# Patient Record
Sex: Male | Born: 1970 | Race: Black or African American | Hispanic: No | Marital: Married | Smoking: Former smoker
Health system: Southern US, Community
[De-identification: ages and names within clinical notes are randomized; demographics above are authoritative.]

## PROBLEM LIST (undated history)

## (undated) DIAGNOSIS — E079 Disorder of thyroid, unspecified: Secondary | ICD-10-CM

## (undated) DIAGNOSIS — I10 Essential (primary) hypertension: Secondary | ICD-10-CM

## (undated) HISTORY — DX: Disorder of thyroid, unspecified: E07.9

## (undated) HISTORY — DX: Essential (primary) hypertension: I10

---

## 2015-01-27 LAB — TSH: TSH: 0.01 u[IU]/mL — AB (ref <=5.90)

## 2015-02-13 ENCOUNTER — Other Ambulatory Visit: Payer: Self-pay | Admitting: "Endocrinology

## 2015-02-13 ENCOUNTER — Ambulatory Visit (INDEPENDENT_AMBULATORY_CARE_PROVIDER_SITE_OTHER): Payer: BLUE CROSS/BLUE SHIELD | Admitting: "Endocrinology

## 2015-02-13 ENCOUNTER — Encounter: Payer: Self-pay | Admitting: "Endocrinology

## 2015-02-13 VITALS — BP 117/78 | HR 95 | Ht 69.0 in | Wt 195.0 lb

## 2015-02-13 DIAGNOSIS — E0590 Thyrotoxicosis, unspecified without thyrotoxic crisis or storm: Secondary | ICD-10-CM

## 2015-02-13 LAB — TSH

## 2015-02-13 LAB — T4, FREE: Free T4: 4.31 ng/dL — ABNORMAL HIGH (ref 0.80–1.80)

## 2015-02-13 MED ORDER — METOPROLOL SUCCINATE ER 50 MG PO TB24: 50.0000 mg | ORAL_TABLET | Freq: Every day | ORAL | Status: DC

## 2015-02-13 NOTE — Progress Notes (Signed)
Subjective:    Patient ID: Darrell Patrick, male    DOB: [birth year],    Past Medical History  Diagnosis Date  . Thyroid disease   . Hypertension    History reviewed. No pertinent past surgical history. Social History   Social History  . Marital Status: Married    Spouse Name: N/A  . Number of Children: N/A  . Years of Education: N/A   Social History Main Topics  . Smoking status: Former Games developer  . Smokeless tobacco: None  . Alcohol Use: 0.0 oz/week    0 Standard drinks or equivalent per week     Comment: Social  . Drug Use: No  . Sexual Activity: Not Asked   Other Topics Concern  . None   Social History Narrative  . None   Outpatient Encounter Prescriptions as of 02/13/2015  Medication Sig  . amLODipine (NORVASC) 10 MG tablet Take 10 mg by mouth daily.  . hydrochlorothiazide (HYDRODIURIL) 50 MG tablet Take 50 mg by mouth daily.  Marland Kitchen LORazepam (ATIVAN) 1 MG tablet Take 1 mg by mouth every 6 (six) hours as needed for anxiety.  . metoprolol succinate (TOPROL-XL) 50 MG 24 hr tablet Take 1 tablet (50 mg total) by mouth daily. Take with or immediately following a meal.  . potassium chloride (K-DUR) 10 MEQ tablet Take 10 mEq by mouth daily.  . [DISCONTINUED] metoprolol succinate (TOPROL-XL) 50 MG 24 hr tablet Take 50 mg by mouth daily. Take with or immediately following a meal.    No facility-administered encounter medications on file as of 02/13/2015.   ALLERGIES: No Known Allergies VACCINATION STATUS:  There is no immunization history on file for this patient.  HPI  The patient presents today with a medical history as above, and is being seen in consultation for hyperthyroidism requested by Dr. Ardean Larsen.  The patient has been dealing with symptoms of palpitations, weight loss of 25 lbs since February 2016, heat intolerance, sweating, fatigue.  These symptoms are progressively worsening and troubling to patient. He visited the Hospital in [place] where he was told  to have hyperthyroidism , started on Metoprolol 25 mg po qday. He has suppressed TSH on 2 occasions, the last one from 01/27/2015 less than 0.006. Has no documented T4 or T3. He has had some relief from tremors and palpitations.  The patient denies family history of thyroid dysfunction  .  patient denies personal history of goiter. Patient is not on any anti-thyroid medications nor on any thyroid hormone supplements. Patient  is willing to proceed with appropriate work up and therapy for thyrotoxicosis.   Review of Systems  Constitutional: Positive for fatigue and unexpected weight change.  HENT: Negative for dental problem, mouth sores and trouble swallowing.   Eyes: Negative for visual disturbance.  Respiratory: Negative for cough, choking, chest tightness, shortness of breath and wheezing.   Cardiovascular: Positive for palpitations. Negative for chest pain and leg swelling.  Gastrointestinal: Negative for nausea, vomiting, abdominal pain, diarrhea, constipation and abdominal distention.  Endocrine: Positive for heat intolerance. Negative for polydipsia, polyphagia and polyuria.  Genitourinary: Negative for dysuria, urgency, hematuria and flank pain.  Musculoskeletal: Negative for myalgias, back pain, gait problem and neck pain.  Skin: Negative for pallor, rash and wound.  Neurological: Positive for tremors. Negative for seizures, syncope, weakness, numbness and headaches.  Psychiatric/Behavioral: Negative.  Negative for confusion and dysphoric mood.    Objective:    BP 117/78 mmHg  Pulse 95  Ht 5\' 9"  (1.753 m)  Wt 195 lb (88.451 kg)  BMI 28.78 kg/m2  SpO2 98%  Wt Readings from Last 3 Encounters:  02/13/15 195 lb (88.451 kg)    Physical Exam  Constitutional: He is oriented to person, place, and time. He appears well-developed and well-nourished. He is cooperative. No distress.  HENT:  Head: Normocephalic and atraumatic.  Eyes: EOM are normal.  Neck: Normal range of motion.  Neck supple. No tracheal deviation present. Thyromegaly present.  His thyroid is enlarged to 11/2 times normal and has bruits.  Cardiovascular: Normal rate, S1 normal, S2 normal and normal heart sounds.  Exam reveals no gallop.   No murmur heard. Pulses:      Dorsalis pedis pulses are 1+ on the right side, and 1+ on the left side.       Posterior tibial pulses are 1+ on the right side, and 1+ on the left side.  Pulmonary/Chest: Breath sounds normal. No respiratory distress. He has no wheezes.  Abdominal: Soft. Bowel sounds are normal. He exhibits no distension. There is no tenderness. There is no guarding and no CVA tenderness.  Musculoskeletal: He exhibits no edema.       Right shoulder: He exhibits no swelling and no deformity.  Neurological: He is alert and oriented to person, place, and time. He has normal strength and normal reflexes. No cranial nerve deficit or sensory deficit. Gait normal.  Skin: Skin is warm and dry. No rash noted. No cyanosis. Nails show no clubbing.  Psychiatric: He has a normal mood and affect. His speech is normal and behavior is normal. Judgment and thought content normal. Cognition and memory are normal.        Assessment & Plan:   1. Hyperthyroidism  Patient's history and most recent labs are reviewed. Findings are consistent with thyrotoxicosis likely from hyperthyroidism. The potential risks of untreated thyrotoxicosis and the need for definitive therapy have been discussed in detail with the patient, and the patient agrees to proceed with plan.   I like to repeat full profile thyroid function tests today and confirmatory thyroid uptake and scan will be scheduled to be done as soon as possible.  He will be sent to lab today to obtain: - TSH, free t4, and - Thyroid peroxidase antibody/ Thyroglobulin antibody  - NM THYROID SNG UPTAKE W/IMAGING  Therapy may involve RAI ablation of the thyroid, with subsequent need for lifelong thyroid hormone  replacement. Pt is made aware of this fact and willing to proceed. The patient will return in 10 days for treatment decision. I increase his metoprolol to 50 mg by mouth dailyl for symptomatic relief.  Follow up plan: Return in about 10 days (around 02/23/2015) for overactive thyroid.  Marquis Lunch, MD Phone: [phone]  Fax: [phone]   02/13/2015, 9:18 AM

## 2015-02-14 LAB — THYROGLOBULIN ANTIBODY: Thyroglobulin Ab: 1 IU/mL (ref <2)

## 2015-02-14 LAB — THYROID PEROXIDASE ANTIBODY: THYROID PEROXIDASE ANTIBODY: 807 [IU]/mL — AB (ref <9)

## 2015-02-18 ENCOUNTER — Telehealth: Payer: Self-pay

## 2015-02-18 ENCOUNTER — Telehealth: Payer: Self-pay | Admitting: "Endocrinology

## 2015-02-18 NOTE — Telephone Encounter (Signed)
Pts wife left message requesting a refill on Ativan. Dr Fransico Him does not prescribe Ativan. Called number provided. No answer. No voice.

## 2015-02-18 NOTE — Telephone Encounter (Signed)
wants ativan refilled: I let Mrs. Manner know that Dr. Fransico Him did not prescribe the ativan originally and he may not refill. Let her know she may have to go to her PCP to refill.

## 2015-02-19 ENCOUNTER — Encounter (HOSPITAL_COMMUNITY): Payer: BLUE CROSS/BLUE SHIELD

## 2015-02-19 NOTE — Telephone Encounter (Signed)
That is right. Patient has to contact who ever prescribed the ativan.

## 2015-02-20 ENCOUNTER — Other Ambulatory Visit (HOSPITAL_COMMUNITY): Payer: Self-pay

## 2015-02-23 ENCOUNTER — Encounter (HOSPITAL_COMMUNITY)
Admission: RE | Admit: 2015-02-23 | Discharge: 2015-02-23 | Disposition: A | Discharge status: Home / Self Care | Payer: BLUE CROSS/BLUE SHIELD | Source: Ambulatory Visit | Attending: "Endocrinology | Admitting: "Endocrinology

## 2015-02-23 ENCOUNTER — Encounter (HOSPITAL_COMMUNITY): Payer: Self-pay

## 2015-02-23 DIAGNOSIS — E0590 Thyrotoxicosis, unspecified without thyrotoxic crisis or storm: Secondary | ICD-10-CM

## 2015-02-23 DIAGNOSIS — I10 Essential (primary) hypertension: Secondary | ICD-10-CM | POA: Insufficient documentation

## 2015-02-23 DIAGNOSIS — R634 Abnormal weight loss: Secondary | ICD-10-CM | POA: Insufficient documentation

## 2015-02-23 DIAGNOSIS — Z87891 Personal history of nicotine dependence: Secondary | ICD-10-CM | POA: Insufficient documentation

## 2015-02-23 DIAGNOSIS — R002 Palpitations: Secondary | ICD-10-CM | POA: Insufficient documentation

## 2015-02-23 MED ORDER — SODIUM IODIDE I 131 CAPSULE
14.0000 | Freq: Once | INTRAVENOUS | Status: AC | PRN
  Administered 2015-02-23: 14 via ORAL

## 2015-02-24 ENCOUNTER — Encounter (HOSPITAL_COMMUNITY): Payer: Self-pay

## 2015-02-24 ENCOUNTER — Encounter (HOSPITAL_COMMUNITY)
Admission: RE | Admit: 2015-02-24 | Discharge: 2015-02-24 | Disposition: A | Discharge status: Home / Self Care | Payer: BLUE CROSS/BLUE SHIELD | Source: Ambulatory Visit | Attending: "Endocrinology | Admitting: "Endocrinology

## 2015-02-24 DIAGNOSIS — I10 Essential (primary) hypertension: Secondary | ICD-10-CM | POA: Diagnosis not present

## 2015-02-24 DIAGNOSIS — R002 Palpitations: Secondary | ICD-10-CM | POA: Diagnosis not present

## 2015-02-24 DIAGNOSIS — Z87891 Personal history of nicotine dependence: Secondary | ICD-10-CM | POA: Diagnosis not present

## 2015-02-24 DIAGNOSIS — R634 Abnormal weight loss: Secondary | ICD-10-CM | POA: Diagnosis not present

## 2015-02-24 DIAGNOSIS — E0590 Thyrotoxicosis, unspecified without thyrotoxic crisis or storm: Secondary | ICD-10-CM | POA: Diagnosis present

## 2015-02-24 MED ORDER — SODIUM PERTECHNETATE TC 99M INJECTION: 10.0000 | Freq: Once | INTRAVENOUS | Status: DC | PRN

## 2015-02-26 ENCOUNTER — Ambulatory Visit: Payer: BLUE CROSS/BLUE SHIELD | Admitting: "Endocrinology

## 2015-03-04 ENCOUNTER — Encounter: Payer: Self-pay | Admitting: "Endocrinology

## 2015-03-04 ENCOUNTER — Ambulatory Visit (INDEPENDENT_AMBULATORY_CARE_PROVIDER_SITE_OTHER): Payer: BLUE CROSS/BLUE SHIELD | Admitting: "Endocrinology

## 2015-03-04 VITALS — BP 120/82 | HR 88 | Ht 69.0 in | Wt 185.0 lb

## 2015-03-04 DIAGNOSIS — E0590 Thyrotoxicosis, unspecified without thyrotoxic crisis or storm: Secondary | ICD-10-CM | POA: Diagnosis not present

## 2015-03-04 DIAGNOSIS — E0500 Thyrotoxicosis with diffuse goiter without thyrotoxic crisis or storm: Secondary | ICD-10-CM | POA: Diagnosis not present

## 2015-03-04 MED ORDER — METOPROLOL SUCCINATE ER 50 MG PO TB24: 50.0000 mg | ORAL_TABLET | Freq: Every day | ORAL | Status: DC

## 2015-03-04 NOTE — Progress Notes (Signed)
Subjective:    Patient ID: Darrell Patrick, male    DOB: November 18, 1970,    Past Medical History  Diagnosis Date  . Thyroid disease   . Hypertension    No past surgical history on file. Social History   Social History  . Marital Status: Married    Spouse Name: N/A  . Number of Children: N/A  . Years of Education: N/A   Social History Main Topics  . Smoking status: Former Games developer  . Smokeless tobacco: None  . Alcohol Use: 0.0 oz/week    0 Standard drinks or equivalent per week     Comment: Social  . Drug Use: No  . Sexual Activity: Not Asked   Other Topics Concern  . None   Social History Narrative   Outpatient Encounter Prescriptions as of 03/04/2015  Medication Sig  . amLODipine (NORVASC) 10 MG tablet Take 10 mg by mouth daily.  . hydrochlorothiazide (HYDRODIURIL) 50 MG tablet Take 50 mg by mouth daily.  Marland Kitchen LORazepam (ATIVAN) 1 MG tablet Take 1 mg by mouth every 6 (six) hours as needed for anxiety.  . metoprolol succinate (TOPROL-XL) 50 MG 24 hr tablet Take 1 tablet (50 mg total) by mouth daily. Take with or immediately following a meal.  . potassium chloride (K-DUR) 10 MEQ tablet Take 10 mEq by mouth daily.  . [DISCONTINUED] metoprolol succinate (TOPROL-XL) 50 MG 24 hr tablet Take 1 tablet (50 mg total) by mouth daily. Take with or immediately following a meal.   No facility-administered encounter medications on file as of 03/04/2015.   ALLERGIES: No Known Allergies VACCINATION STATUS:  There is no immunization history on file for this patient.  HPI  The patient presents today with a medical history as above. He returns for follow-up with repeat thyroid function test and thyroid uptake and scan. These studies confirm hyperthyroidism from Graves' disease.  The patient has been dealing with symptoms of palpitations, weight loss of 35  lbs since February 2016, heat intolerance, sweating, fatigue.  These symptoms are progressively worsening and troubling to patient.   He has had some relief from tremors and palpitations on metoprolol 50 mg extended release by mouth daily.  The patient denies family history of thyroid dysfunction  .  patient denies personal history of goiter. Patient is not on any anti-thyroid medications nor on any thyroid hormone supplements. Patient  is willing to proceed with appropriate therapy for thyrotoxicosis.   Review of Systems  Constitutional: Positive for fatigue and unexpected weight change.  HENT: Negative for dental problem, mouth sores and trouble swallowing.   Eyes: Negative for visual disturbance.  Respiratory: Negative for cough, choking, chest tightness, shortness of breath and wheezing.   Cardiovascular: Positive for palpitations. Negative for chest pain and leg swelling.  Gastrointestinal: Negative for nausea, vomiting, abdominal pain, diarrhea, constipation and abdominal distention.  Endocrine: Positive for heat intolerance. Negative for polydipsia, polyphagia and polyuria.  Genitourinary: Negative for dysuria, urgency, hematuria and flank pain.  Musculoskeletal: Negative for myalgias, back pain, gait problem and neck pain.  Skin: Negative for pallor, rash and wound.  Neurological: Positive for tremors. Negative for seizures, syncope, weakness, numbness and headaches.  Psychiatric/Behavioral: Negative.  Negative for confusion and dysphoric mood.    Objective:    BP 120/82 mmHg  Pulse 88  Ht 5\' 9"  (1.753 m)  Wt 185 lb (83.915 kg)  BMI 27.31 kg/m2  SpO2 98%  Wt Readings from Last 3 Encounters:  03/04/15 185 lb (83.915 kg)  02/13/15  195 lb (88.451 kg)    Physical Exam  Constitutional: He is oriented to person, place, and time. He appears well-developed and well-nourished. He is cooperative. No distress.  HENT:  Head: Normocephalic and atraumatic.  Eyes: EOM are normal.  Neck: Normal range of motion. Neck supple. No tracheal deviation present. Thyromegaly present.  His thyroid is enlarged to 11/2 times  normal and has bruits.  Cardiovascular: Normal rate, S1 normal, S2 normal and normal heart sounds.  Exam reveals no gallop.   No murmur heard. Pulses:      Dorsalis pedis pulses are 1+ on the right side, and 1+ on the left side.       Posterior tibial pulses are 1+ on the right side, and 1+ on the left side.  Pulmonary/Chest: Breath sounds normal. No respiratory distress. He has no wheezes.  Abdominal: Soft. Bowel sounds are normal. He exhibits no distension. There is no tenderness. There is no guarding and no CVA tenderness.  Musculoskeletal: He exhibits no edema.       Right shoulder: He exhibits no swelling and no deformity.  Neurological: He is alert and oriented to person, place, and time. He has normal strength and normal reflexes. No cranial nerve deficit or sensory deficit. Gait normal.  Skin: Skin is warm and dry. No rash noted. No cyanosis. Nails show no clubbing.  Psychiatric: He has a normal mood and affect. His speech is normal and behavior is normal. Judgment and thought content normal. Cognition and memory are normal.   Recent Results (from the past 2160 hour(s))  TSH     Status: Abnormal   Collection Time: 01/27/15 12:00 AM  Result Value Ref Range   TSH 0.01 (A) .41 - 5.90 uIU/mL  TSH     Status: Abnormal   Collection Time: 02/13/15  9:21 AM  Result Value Ref Range   TSH <0.008 (L) 0.350 - 4.500 uIU/mL  T4, free     Status: Abnormal   Collection Time: 02/13/15  9:21 AM  Result Value Ref Range   Free T4 4.31 (H) 0.80 - 1.80 ng/dL  Thyroid peroxidase antibody     Status: Abnormal   Collection Time: 02/13/15  9:21 AM  Result Value Ref Range   Thyroperoxidase Ab SerPl-aCnc 807 (H) <9 IU/mL  Thyroglobulin antibody     Status: None   Collection Time: 02/13/15  9:21 AM  Result Value Ref Range   Thyroglobulin Ab 1 <2 IU/mL   Nuclear medicine thyroid uptake and scan FINDINGS: 02/24/2015 Markedly elevated 24 hour radio iodine uptake of 70.3%. Homogeneous increased tracer  distribution throughout both thyroid lobes. Findings consistent with Graves disease.    Assessment & Plan:   1. Hyperthyroidism  Patient's history and most recent labs are reviewed. Findings are consistent with thyrotoxicosis likely from hyperthyroidism/Graves Disease. The potential risks of untreated thyrotoxicosis and the need for definitive therapy have been discussed in detail with the patient, and the patient agrees to proceed with plan.  -Options of therapy discussed with him. The best option for him will be RAI ablation of the thyroid, with subsequent need for lifelong thyroid hormone replacement. Pt is made aware of this fact and willing to proceed. I will arrange for this to be done as soon as possible. The patient will return in 6 weeks with repeat thyroid function test. I  advised him to continue metoprolol 50 mg by mouth daily for symptomatic relief.  Follow up plan: Return in about 6 weeks (around 04/15/2015) for overactive thyroid.  Marquis Lunch, MD Phone: [phone]  Fax: [phone]   03/04/2015, 9:21 AM

## 2015-03-06 ENCOUNTER — Encounter (HOSPITAL_COMMUNITY): Payer: BLUE CROSS/BLUE SHIELD

## 2015-03-09 ENCOUNTER — Encounter (HOSPITAL_COMMUNITY): Payer: Self-pay

## 2015-03-09 ENCOUNTER — Encounter (HOSPITAL_COMMUNITY)
Admission: RE | Admit: 2015-03-09 | Discharge: 2015-03-09 | Disposition: A | Discharge status: Home / Self Care | Payer: BLUE CROSS/BLUE SHIELD | Source: Ambulatory Visit | Attending: "Endocrinology | Admitting: "Endocrinology

## 2015-03-09 DIAGNOSIS — E0500 Thyrotoxicosis with diffuse goiter without thyrotoxic crisis or storm: Secondary | ICD-10-CM | POA: Diagnosis present

## 2015-03-09 MED ORDER — SODIUM IODIDE I 131 CAPSULE
15.0000 | Freq: Once | INTRAVENOUS | Status: AC | PRN
Start: 2015-03-09 — End: 2015-03-09
  Administered 2015-03-09: 14.7 via ORAL

## 2015-03-11 ENCOUNTER — Ambulatory Visit: Payer: Self-pay | Admitting: "Endocrinology

## 2015-04-15 ENCOUNTER — Ambulatory Visit: Payer: BLUE CROSS/BLUE SHIELD | Admitting: "Endocrinology

## 2015-04-30 ENCOUNTER — Other Ambulatory Visit: Payer: Self-pay | Admitting: "Endocrinology

## 2015-05-01 ENCOUNTER — Ambulatory Visit: Payer: BLUE CROSS/BLUE SHIELD | Admitting: "Endocrinology

## 2015-05-01 LAB — TSH: TSH: 0.596 u[IU]/mL (ref 0.450–4.500)

## 2015-05-01 LAB — T4, FREE: FREE T4: 0.3 ng/dL — AB (ref 0.82–1.77)

## 2015-05-08 ENCOUNTER — Ambulatory Visit (INDEPENDENT_AMBULATORY_CARE_PROVIDER_SITE_OTHER): Payer: BLUE CROSS/BLUE SHIELD | Admitting: "Endocrinology

## 2015-05-08 ENCOUNTER — Encounter: Payer: Self-pay | Admitting: "Endocrinology

## 2015-05-08 VITALS — BP 130/86 | HR 75 | Ht 69.0 in | Wt 206.0 lb

## 2015-05-08 DIAGNOSIS — I10 Essential (primary) hypertension: Secondary | ICD-10-CM | POA: Diagnosis not present

## 2015-05-08 DIAGNOSIS — E890 Postprocedural hypothyroidism: Secondary | ICD-10-CM | POA: Insufficient documentation

## 2015-05-08 DIAGNOSIS — E032 Hypothyroidism due to medicaments and other exogenous substances: Secondary | ICD-10-CM

## 2015-05-08 MED ORDER — LEVOTHYROXINE SODIUM 112 MCG PO TABS: 112.0000 ug | ORAL_TABLET | Freq: Every day | ORAL | Status: DC

## 2015-05-08 NOTE — Progress Notes (Signed)
Subjective:    Patient ID: Darrell Patrick, male    DOB: [birth year],    Past Medical History  Diagnosis Date  . Thyroid disease   . Hypertension    No past surgical history on file. Social History   Social History  . Marital Status: Married    Spouse Name: N/A  . Number of Children: N/A  . Years of Education: N/A   Social History Main Topics  . Smoking status: Former Games developer  . Smokeless tobacco: None  . Alcohol Use: 0.0 oz/week    0 Standard drinks or equivalent per week     Comment: Social  . Drug Use: No  . Sexual Activity: Not Asked   Other Topics Concern  . None   Social History Narrative   Outpatient Encounter Prescriptions as of 05/08/2015  Medication Sig  . amLODipine (NORVASC) 10 MG tablet Take 10 mg by mouth daily.  . hydrochlorothiazide (HYDRODIURIL) 50 MG tablet Take 50 mg by mouth daily.  Marland Kitchen levothyroxine (SYNTHROID, LEVOTHROID) 112 MCG tablet Take 1 tablet (112 mcg total) by mouth daily.  Marland Kitchen LORazepam (ATIVAN) 1 MG tablet Take 1 mg by mouth every 6 (six) hours as needed for anxiety.  . [DISCONTINUED] metoprolol succinate (TOPROL-XL) 50 MG 24 hr tablet Take 1 tablet (50 mg total) by mouth daily. Take with or immediately following a meal.  . [DISCONTINUED] potassium chloride (K-DUR) 10 MEQ tablet Take 10 mEq by mouth daily.   No facility-administered encounter medications on file as of 05/08/2015.   ALLERGIES: No Known Allergies VACCINATION STATUS:  There is no immunization history on file for this patient.  HPI  The patient presents today with repeat thyroid function tests several weeks after RAI therapy for Graves' disease.  He is gaining weight, feels fatigued. He received radioactive iodine therapy for Graves' disease on 03/09/2015. He remains on metoprolol.  The patient denies family history of thyroid dysfunction  .  patient denies personal history of goiter. Patient is not on any anti-thyroid medications nor on any thyroid hormone  supplements.  Review of Systems  Constitutional: Positive for unexpected weight change. Negative for fatigue.  HENT: Negative for dental problem, mouth sores and trouble swallowing.   Eyes: Negative for visual disturbance.  Respiratory: Negative for cough, choking, chest tightness, shortness of breath and wheezing.   Cardiovascular: Negative for chest pain, palpitations and leg swelling.  Gastrointestinal: Negative for nausea, vomiting, abdominal pain, diarrhea, constipation and abdominal distention.  Endocrine: Negative for heat intolerance, polydipsia, polyphagia and polyuria.  Genitourinary: Negative for dysuria, urgency, hematuria and flank pain.  Musculoskeletal: Negative for myalgias, back pain, gait problem and neck pain.  Skin: Negative for pallor, rash and wound.  Neurological: Negative for tremors, seizures, syncope, weakness, numbness and headaches.  Psychiatric/Behavioral: Negative.  Negative for confusion and dysphoric mood.    Objective:    BP 130/86 mmHg  Pulse 75  Ht 5\' 9"  (1.753 m)  Wt 206 lb (93.441 kg)  BMI 30.41 kg/m2  SpO2 96%  Wt Readings from Last 3 Encounters:  05/08/15 206 lb (93.441 kg)  03/04/15 185 lb (83.915 kg)  02/13/15 195 lb (88.451 kg)    Physical Exam  Constitutional: He is oriented to person, place, and time. He appears well-developed and well-nourished. He is cooperative. No distress.  HENT:  Head: Normocephalic and atraumatic.  Eyes: EOM are normal.  Neck: Normal range of motion. Neck supple. No tracheal deviation present. Thyromegaly present.  Cardiovascular: Normal rate, S1 normal, S2 normal and  normal heart sounds.  Exam reveals no gallop.   No murmur heard. Pulses:      Dorsalis pedis pulses are 1+ on the right side, and 1+ on the left side.       Posterior tibial pulses are 1+ on the right side, and 1+ on the left side.  Pulmonary/Chest: Breath sounds normal. No respiratory distress. He has no wheezes.  Abdominal: Soft. Bowel sounds  are normal. He exhibits no distension. There is no tenderness. There is no guarding and no CVA tenderness.  Musculoskeletal: He exhibits no edema.       Right shoulder: He exhibits no swelling and no deformity.  Neurological: He is alert and oriented to person, place, and time. He has normal strength and normal reflexes. No cranial nerve deficit or sensory deficit. Gait normal.  Skin: Skin is warm and dry. No rash noted. No cyanosis. Nails show no clubbing.  Psychiatric: He has a normal mood and affect. His speech is normal and behavior is normal. Judgment and thought content normal. Cognition and memory are normal.   Recent Results (from the past 2160 hour(s))  TSH     Status: Abnormal   Collection Time: 02/13/15  9:21 AM  Result Value Ref Range   TSH <0.008 (L) 0.350 - 4.500 uIU/mL  T4, free     Status: Abnormal   Collection Time: 02/13/15  9:21 AM  Result Value Ref Range   Free T4 4.31 (H) 0.80 - 1.80 ng/dL  Thyroid peroxidase antibody     Status: Abnormal   Collection Time: 02/13/15  9:21 AM  Result Value Ref Range   Thyroperoxidase Ab SerPl-aCnc 807 (H) <9 IU/mL  Thyroglobulin antibody     Status: None   Collection Time: 02/13/15  9:21 AM  Result Value Ref Range   Thyroglobulin Ab 1 <2 IU/mL  T4, free     Status: Abnormal   Collection Time: 04/30/15 11:25 AM  Result Value Ref Range   Free T4 0.30 (L) 0.82 - 1.77 ng/dL  TSH     Status: None   Collection Time: 04/30/15 11:25 AM  Result Value Ref Range   TSH 0.596 0.450 - 4.500 uIU/mL   Nuclear medicine thyroid uptake and scan FINDINGS: 02/24/2015 Markedly elevated 24 hour radio iodine uptake of 70.3%. Homogeneous increased tracer distribution throughout both thyroid lobes. Findings consistent with Graves disease.  He is status post RAI therapy on 03/09/2015.  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   Assessment & Plan:   1.  RAI  hypothyroidism   -He will be initiated with thyroid  hormone replacement. I will start with 112 g of levothyroxine by mouth every morning.  - We discussed about correct intake of levothyroxine, at fasting, with water, separated by at least 30 minutes from breakfast, and separated by more than 4 hours from calcium, iron, multivitamins, acid reflux medications (PPIs). -Patient is made aware of the fact that thyroid hormone replacement is needed for life, dose to be adjusted by periodic monitoring of thyroid function tests.  -Given his complaint of low libido and erectile dysfunction, I will request total testosterone, with his next blood work.  Follow up plan: Return in about 3 months (around 08/06/2015) for underactive thyroid, follow up with pre-visit labs.  Marquis Lunch, MD Phone: [phone]  Fax: [phone]   05/08/2015, 3:27 PM

## 2015-08-02 ENCOUNTER — Other Ambulatory Visit: Payer: Self-pay | Admitting: "Endocrinology

## 2015-08-10 ENCOUNTER — Ambulatory Visit: Payer: BLUE CROSS/BLUE SHIELD | Admitting: "Endocrinology

## 2015-08-28 ENCOUNTER — Ambulatory Visit (INDEPENDENT_AMBULATORY_CARE_PROVIDER_SITE_OTHER): Payer: BLUE CROSS/BLUE SHIELD | Admitting: "Endocrinology

## 2015-08-28 ENCOUNTER — Encounter: Payer: Self-pay | Admitting: "Endocrinology

## 2015-08-28 VITALS — BP 138/85 | HR 80 | Ht 69.0 in | Wt 220.0 lb

## 2015-08-28 DIAGNOSIS — E032 Hypothyroidism due to medicaments and other exogenous substances: Secondary | ICD-10-CM | POA: Diagnosis not present

## 2015-08-28 MED ORDER — LEVOTHYROXINE SODIUM 137 MCG PO TABS: 137.0000 ug | ORAL_TABLET | Freq: Every day | ORAL | Status: DC

## 2015-08-28 NOTE — Progress Notes (Signed)
Subjective:    Patient ID: Darrell Patrick, male    DOB: 1970/09/11,    Past Medical History  Diagnosis Date  . Thyroid disease   . Hypertension    No past surgical history on file. Social History   Social History  . Marital Status: Married    Spouse Name: N/A  . Number of Children: N/A  . Years of Education: N/A   Social History Main Topics  . Smoking status: Former Games developer  . Smokeless tobacco: None  . Alcohol Use: 0.0 oz/week    0 Standard drinks or equivalent per week     Comment: Social  . Drug Use: No  . Sexual Activity: Not Asked   Other Topics Concern  . None   Social History Narrative   Outpatient Encounter Prescriptions as of 08/28/2015  Medication Sig  . amLODipine (NORVASC) 10 MG tablet Take 10 mg by mouth daily.  . hydrochlorothiazide (HYDRODIURIL) 50 MG tablet Take 50 mg by mouth daily.  Marland Kitchen levothyroxine (SYNTHROID, LEVOTHROID) 137 MCG tablet Take 1 tablet (137 mcg total) by mouth daily before breakfast.  . LORazepam (ATIVAN) 1 MG tablet Take 1 mg by mouth every 6 (six) hours as needed for anxiety.  . [DISCONTINUED] levothyroxine (SYNTHROID, LEVOTHROID) 112 MCG tablet TAKE 1 TABLET (112 MCG TOTAL) BY MOUTH DAILY.   No facility-administered encounter medications on file as of 08/28/2015.   ALLERGIES: No Known Allergies VACCINATION STATUS:  There is no immunization history on file for this patient.  HPI  The patient presents today  To follow-up for RAI induced hypothyroidism. She was given RAI for treatment of Graves' disease.  He is on levothyroxine 112 g by mouth every morning. He reports compliance.  He has gained weight , denies fatigue . He received radioactive iodine therapy for Graves' disease on 03/09/2015.  The patient denies family history of thyroid dysfunction  .  patient denies personal history of goiter. Patient is not on any anti-thyroid medications nor on any thyroid hormone supplements.  Review of Systems  Constitutional:  Positive for unexpected weight change. Negative for fatigue.  HENT: Negative for dental problem, mouth sores and trouble swallowing.   Eyes: Negative for visual disturbance.  Respiratory: Negative for cough, choking, chest tightness, shortness of breath and wheezing.   Cardiovascular: Negative for chest pain, palpitations and leg swelling.  Gastrointestinal: Negative for nausea, vomiting, abdominal pain, diarrhea, constipation and abdominal distention.  Endocrine: Negative for heat intolerance, polydipsia, polyphagia and polyuria.  Genitourinary: Negative for dysuria, urgency, hematuria and flank pain.  Musculoskeletal: Negative for myalgias, back pain, gait problem and neck pain.  Skin: Negative for pallor, rash and wound.  Neurological: Negative for tremors, seizures, syncope, weakness, numbness and headaches.  Psychiatric/Behavioral: Negative.  Negative for confusion and dysphoric mood.    Objective:    BP 138/85 mmHg  Pulse 80  Ht 5\' 9"  (1.753 m)  Wt 220 lb (99.791 kg)  BMI 32.47 kg/m2  SpO2 98%  Wt Readings from Last 3 Encounters:  08/28/15 220 lb (99.791 kg)  05/08/15 206 lb (93.441 kg)  03/04/15 185 lb (83.915 kg)    Physical Exam  Constitutional: He is oriented to person, place, and time. He appears well-developed and well-nourished. He is cooperative. No distress.  HENT:  Head: Normocephalic and atraumatic.  Eyes: EOM are normal.  Neck: Normal range of motion. Neck supple. No tracheal deviation present. Thyromegaly present.  Cardiovascular: Normal rate, S1 normal, S2 normal and normal heart sounds.  Exam reveals no  gallop.   No murmur heard. Pulses:      Dorsalis pedis pulses are 1+ on the right side, and 1+ on the left side.       Posterior tibial pulses are 1+ on the right side, and 1+ on the left side.  Pulmonary/Chest: Breath sounds normal. No respiratory distress. He has no wheezes.  Abdominal: Soft. Bowel sounds are normal. He exhibits no distension. There is no  tenderness. There is no guarding and no CVA tenderness.  Musculoskeletal: He exhibits no edema.       Right shoulder: He exhibits no swelling and no deformity.  Neurological: He is alert and oriented to person, place, and time. He has normal strength and normal reflexes. No cranial nerve deficit or sensory deficit. Gait normal.  Skin: Skin is warm and dry. No rash noted. No cyanosis. Nails show no clubbing.  Psychiatric: He has a normal mood and affect. His speech is normal and behavior is normal. Judgment and thought content normal. Cognition and memory are normal.   Labs from 08/10/2015 from [facility] show free T4 1 0.04, TSH elevated at 12.77 Testosterone 346 g  Assessment & Plan:   1.  RAI  hypothyroidism  -His thyroid function tests are consistent with inadequate replacement. I will increase his levothyroxine to 137 g by mouth every morning  - We discussed about correct intake of levothyroxine, at fasting, with water, separated by at least 30 minutes from breakfast, and separated by more than 4 hours from calcium, iron, multivitamins, acid reflux medications (PPIs). -Patient is made aware of the fact that thyroid hormone replacement is needed for life, dose to be adjusted by periodic monitoring of thyroid function tests.  Follow up plan: Return in about 6 months (around 02/28/2016) for follow up with pre-visit labs.  Marquis Lunch, MD Phone: [phone]  Fax: [phone]   08/28/2015, 12:02 PM

## 2016-02-26 ENCOUNTER — Other Ambulatory Visit: Payer: Self-pay | Admitting: "Endocrinology

## 2016-02-26 ENCOUNTER — Encounter: Payer: Self-pay | Admitting: "Endocrinology

## 2016-03-03 ENCOUNTER — Encounter: Payer: Self-pay | Admitting: "Endocrinology

## 2016-03-03 ENCOUNTER — Ambulatory Visit (INDEPENDENT_AMBULATORY_CARE_PROVIDER_SITE_OTHER): Payer: BLUE CROSS/BLUE SHIELD | Admitting: "Endocrinology

## 2016-03-03 VITALS — BP 130/88 | HR 81 | Ht 69.0 in | Wt 221.0 lb

## 2016-03-03 DIAGNOSIS — E890 Postprocedural hypothyroidism: Secondary | ICD-10-CM

## 2016-03-03 MED ORDER — LEVOTHYROXINE SODIUM 137 MCG PO TABS: ORAL_TABLET | ORAL | 4 refills | Status: DC

## 2016-03-03 NOTE — Progress Notes (Signed)
Subjective:    Patient ID: Darrell Patrick, male    DOB: [birth year],    Past Medical History:  Diagnosis Date  . Hypertension   . Thyroid disease    History reviewed. No pertinent surgical history. Social History   Social History  . Marital status: Married    Spouse name: N/A  . Number of children: N/A  . Years of education: N/A   Social History Main Topics  . Smoking status: Former Games developer  . Smokeless tobacco: Never Used  . Alcohol use 0.0 oz/week     Comment: Social  . Drug use: No  . Sexual activity: Not Asked   Other Topics Concern  . None   Social History Narrative  . None   Outpatient Encounter Prescriptions as of 03/03/2016  Medication Sig  . amLODipine (NORVASC) 10 MG tablet Take 10 mg by mouth daily.  . hydrochlorothiazide (HYDRODIURIL) 50 MG tablet Take 50 mg by mouth daily.  Marland Kitchen levothyroxine (SYNTHROID, LEVOTHROID) 137 MCG tablet TAKE ONE TABLET BY MOUTH ONCE DAILY BEFORE BREAKFAST  . [DISCONTINUED] levothyroxine (SYNTHROID, LEVOTHROID) 137 MCG tablet TAKE ONE TABLET BY MOUTH ONCE DAILY BEFORE BREAKFAST  . LORazepam (ATIVAN) 1 MG tablet Take 1 mg by mouth every 6 (six) hours as needed for anxiety.   No facility-administered encounter medications on file as of 03/03/2016.    ALLERGIES: No Known Allergies VACCINATION STATUS:  There is no immunization history on file for this patient.  HPI  The patient presents today  to follow-up for RAI induced hypothyroidism.  He is status post  RAI for treatment of Graves' disease.  He is on levothyroxine 137 g by mouth every morning. He reports compliance.  He has steadey weight , denies fatigue . He received radioactive iodine therapy for Graves' disease on 03/09/2015.  The patient denies family history of thyroid dysfunction  .  patient denies personal history of goiter. Patient is not on any anti-thyroid medications nor on any thyroid hormone supplements.  Review of Systems  Constitutional: Negative for  fatigue and unexpected weight change.  HENT: Negative for dental problem, mouth sores and trouble swallowing.   Eyes: Negative for visual disturbance.  Respiratory: Negative for cough, choking, chest tightness, shortness of breath and wheezing.   Cardiovascular: Negative for chest pain, palpitations and leg swelling.  Gastrointestinal: Negative for abdominal distention, abdominal pain, constipation, diarrhea, nausea and vomiting.  Endocrine: Negative for heat intolerance, polydipsia, polyphagia and polyuria.  Genitourinary: Negative for dysuria, flank pain, hematuria and urgency.  Musculoskeletal: Negative for back pain, gait problem, myalgias and neck pain.  Skin: Negative for pallor, rash and wound.  Neurological: Negative for tremors, seizures, syncope, weakness, numbness and headaches.  Psychiatric/Behavioral: Negative.  Negative for confusion and dysphoric mood.    Objective:    BP 130/88   Pulse 81   Ht 5\' 9"  (1.753 m)   Wt 221 lb (100.2 kg)   BMI 32.64 kg/m   Wt Readings from Last 3 Encounters:  03/03/16 221 lb (100.2 kg)  08/28/15 220 lb (99.8 kg)  05/08/15 206 lb (93.4 kg)    Physical Exam  Constitutional: He is oriented to person, place, and time. He appears well-developed and well-nourished. He is cooperative. No distress.  HENT:  Head: Normocephalic and atraumatic.  Eyes: EOM are normal.  Neck: Normal range of motion. Neck supple. No tracheal deviation present. No thyromegaly present.  Cardiovascular: Normal rate, S1 normal, S2 normal and normal heart sounds.  Exam reveals no gallop.  No murmur heard. Pulses:      Dorsalis pedis pulses are 2+ on the right side, and 2+ on the left side.       Posterior tibial pulses are 2+ on the right side, and 2+ on the left side.  Pulmonary/Chest: Breath sounds normal. No respiratory distress. He has no wheezes.  Abdominal: Soft. Bowel sounds are normal. He exhibits no distension. There is no tenderness. There is no guarding and  no CVA tenderness.  Musculoskeletal: He exhibits no edema.       Right shoulder: He exhibits no swelling and no deformity.  Neurological: He is alert and oriented to person, place, and time. He has normal strength and normal reflexes. No cranial nerve deficit or sensory deficit. Gait normal.  Skin: Skin is warm and dry. No rash noted. No cyanosis. Nails show no clubbing.  Psychiatric: He has a normal mood and affect. His speech is normal and behavior is normal. Judgment and thought content normal. Cognition and memory are normal.   Labs : 1)  02/26/2016 showed free T4 1.48, TSH 2.46 2) 08/10/2015 Testosterone 346 g  Assessment & Plan:   1.  RAI  hypothyroidism  -His thyroid function tests are consistent with inadequate replacement. I will continue  levothyroxine  137 g by mouth every morning  - We discussed about correct intake of levothyroxine, at fasting, with water, separated by at least 30 minutes from breakfast, and separated by more than 4 hours from calcium, iron, multivitamins, acid reflux medications (PPIs). -Patient is made aware of the fact that thyroid hormone replacement is needed for life, dose to be adjusted by periodic monitoring of thyroid function tests.  Follow up plan: Return in about 1 year (around 03/03/2017) for follow up with pre-visit labs.  Marquis Lunch, MD Phone: [phone]  Fax: [phone]   03/03/2016, 10:24 AM

## 2017-03-06 ENCOUNTER — Ambulatory Visit: Payer: BLUE CROSS/BLUE SHIELD | Admitting: "Endocrinology

## 2017-03-07 ENCOUNTER — Encounter: Payer: Self-pay | Admitting: "Endocrinology

## 2017-03-17 ENCOUNTER — Other Ambulatory Visit: Payer: Self-pay | Admitting: "Endocrinology

## 2017-03-17 ENCOUNTER — Telehealth: Payer: Self-pay | Admitting: *Deleted

## 2017-03-17 MED ORDER — LEVOTHYROXINE SODIUM 137 MCG PO TABS
ORAL_TABLET | ORAL | 0 refills | Status: DC
Start: 2017-03-17 — End: 2017-04-24

## 2017-03-17 NOTE — Telephone Encounter (Signed)
I will send a 1 month Rx, pt needs to return for a visit with repeat labs.

## 2017-03-17 NOTE — Telephone Encounter (Signed)
Patient's wife stating patient needs his thyroid medication refilled to [facility]. Please advise

## 2017-03-17 NOTE — Telephone Encounter (Signed)
I called patient to make him aware, patient did not answer left a message.

## 2017-03-28 ENCOUNTER — Ambulatory Visit: Payer: BLUE CROSS/BLUE SHIELD | Admitting: "Endocrinology

## 2017-04-21 ENCOUNTER — Other Ambulatory Visit: Payer: Self-pay | Admitting: "Endocrinology

## 2017-04-22 LAB — TSH: TSH: 3.44 u[IU]/mL (ref 0.450–4.500)

## 2017-04-22 LAB — T4, FREE: Free T4: 1.48 ng/dL (ref 0.82–1.77)

## 2017-04-24 ENCOUNTER — Other Ambulatory Visit: Payer: Self-pay | Admitting: "Endocrinology

## 2017-05-05 ENCOUNTER — Encounter: Payer: Self-pay | Admitting: "Endocrinology

## 2017-05-05 ENCOUNTER — Ambulatory Visit (INDEPENDENT_AMBULATORY_CARE_PROVIDER_SITE_OTHER): Payer: BLUE CROSS/BLUE SHIELD | Admitting: "Endocrinology

## 2017-05-05 VITALS — BP 134/89 | HR 74 | Ht 69.0 in | Wt 228.0 lb

## 2017-05-05 DIAGNOSIS — I10 Essential (primary) hypertension: Secondary | ICD-10-CM

## 2017-05-05 DIAGNOSIS — E890 Postprocedural hypothyroidism: Secondary | ICD-10-CM

## 2017-05-05 MED ORDER — LEVOTHYROXINE SODIUM 137 MCG PO TABS: ORAL_TABLET | ORAL | 4 refills | Status: DC

## 2017-05-05 NOTE — Progress Notes (Signed)
Subjective:    Patient ID: Darrell Patrick, male    DOB: 11/08/1970,    Past Medical History:  Diagnosis Date  . Hypertension   . Thyroid disease    History reviewed. No pertinent surgical history. Social History   Socioeconomic History  . Marital status: Married    Spouse name: None  . Number of children: None  . Years of education: None  . Highest education level: None  Social Needs  . Financial resource strain: None  . Food insecurity - worry: None  . Food insecurity - inability: None  . Transportation needs - medical: None  . Transportation needs - non-medical: None  Occupational History  . None  Tobacco Use  . Smoking status: Former Games developer  . Smokeless tobacco: Never Used  Substance and Sexual Activity  . Alcohol use: Yes    Alcohol/week: 0.0 oz    Comment: Social  . Drug use: No  . Sexual activity: None  Other Topics Concern  . None  Social History Narrative  . None   Outpatient Encounter Medications as of 05/05/2017  Medication Sig  . amLODipine (NORVASC) 10 MG tablet Take 10 mg by mouth daily.  . hydrochlorothiazide (HYDRODIURIL) 50 MG tablet Take 50 mg by mouth daily.  Marland Kitchen levothyroxine (SYNTHROID, LEVOTHROID) 137 MCG tablet TAKE 1 TABLET BY MOUTH ONCE DAILY BEFORE BREAKFAST -  OFFICE  VISIT  WITH  LABS  NEEDED  FOR  REFILLS  . LORazepam (ATIVAN) 1 MG tablet Take 1 mg by mouth every 6 (six) hours as needed for anxiety.  . [DISCONTINUED] levothyroxine (SYNTHROID, LEVOTHROID) 137 MCG tablet TAKE 1 TABLET BY MOUTH ONCE DAILY BEFORE BREAKFAST -  OFFICE  VISIT  WITH  LABS  NEEDED  FOR  REFILLS   No facility-administered encounter medications on file as of 05/05/2017.    ALLERGIES: No Known Allergies VACCINATION STATUS:  There is no immunization history on file for this patient.  HPI  The patient presents today  to follow-up for RAI induced hypothyroidism.  He is status post  RAI for treatment of Graves' disease.  He is on levothyroxine 137 g by mouth  every morning. He reports compliance.  He reports  progressive weight gain ,  denies fatigue . He denies heat/cold intolerance. He received radioactive iodine therapy for Graves' disease on 03/09/2015.  The patient denies family history of thyroid dysfunction  .  patient denies personal history of goiter.   Review of Systems  Constitutional: Negative for fatigue and unexpected weight change.  HENT: Negative for dental problem, mouth sores and trouble swallowing.   Eyes: Negative for visual disturbance.  Respiratory: Negative for cough, choking, chest tightness, shortness of breath and wheezing.   Cardiovascular: Negative for chest pain, palpitations and leg swelling.  Gastrointestinal: Negative for abdominal distention, abdominal pain, constipation, diarrhea, nausea and vomiting.  Endocrine: Negative for heat intolerance, polydipsia, polyphagia and polyuria.  Genitourinary: Negative for dysuria, flank pain, hematuria and urgency.  Musculoskeletal: Negative for back pain, gait problem, myalgias and neck pain.  Skin: Negative for pallor, rash and wound.  Neurological: Negative for tremors, seizures, syncope, weakness, numbness and headaches.  Psychiatric/Behavioral: Negative.  Negative for confusion and dysphoric mood.    Objective:    BP 134/89   Pulse 74   Ht 5\' 9"  (1.753 m)   Wt 228 lb (103.4 kg)   BMI 33.67 kg/m   Wt Readings from Last 3 Encounters:  05/05/17 228 lb (103.4 kg)  03/03/16 221 lb (100.2 kg)  08/28/15 220 lb (99.8 kg)    Physical Exam  Constitutional: He is oriented to person, place, and time. He appears well-developed and well-nourished. He is cooperative. No distress.  HENT:  Head: Normocephalic and atraumatic.  Eyes: EOM are normal.  Neck: Normal range of motion. Neck supple. No tracheal deviation present. No thyromegaly present.  Cardiovascular: Normal rate, S1 normal, S2 normal and normal heart sounds. Exam reveals no gallop.  No murmur heard. Pulses:       Dorsalis pedis pulses are 2+ on the right side, and 2+ on the left side.       Posterior tibial pulses are 2+ on the right side, and 2+ on the left side.  Pulmonary/Chest: Breath sounds normal. No respiratory distress. He has no wheezes.  Abdominal: Soft. Bowel sounds are normal. He exhibits no distension. There is no tenderness. There is no guarding and no CVA tenderness.  Musculoskeletal: He exhibits no edema.       Right shoulder: He exhibits no swelling and no deformity.  Neurological: He is alert and oriented to person, place, and time. He has normal strength and normal reflexes. No cranial nerve deficit or sensory deficit. Gait normal.  Skin: Skin is warm and dry. No rash noted. No cyanosis. Nails show no clubbing.  Psychiatric: He has a normal mood and affect. His speech is normal and behavior is normal. Judgment and thought content normal. Cognition and memory are normal.   Recent Results (from the past 2160 hour(s))  T4, free     Status: None   Collection Time: 04/21/17  4:10 PM  Result Value Ref Range   Free T4 1.48 0.82 - 1.77 ng/dL  TSH     Status: None   Collection Time: 04/21/17  4:10 PM  Result Value Ref Range   TSH 3.440 0.450 - 4.500 uIU/mL    Labs : 1)  02/26/2016 showed free T4 1.48, TSH 2.46 2) 08/10/2015 Testosterone 346 g  Assessment & Plan:   1.  RAI  hypothyroidism  - He received radioactive iodine thyroid ablation for Graves' disease in November 2017.  - His thyroid function tests are consistent with appropriate replacement. I have advised him to continue levothyroxine 137 g by mouth every morning.    - We discussed about correct intake of levothyroxine, at fasting, with water, separated by at least 30 minutes from breakfast, and separated by more than 4 hours from calcium, iron, multivitamins, acid reflux medications (PPIs). -Patient is made aware of the fact that thyroid hormone replacement is needed for life, dose to be adjusted by periodic monitoring  of thyroid function tests.  2. Hypertension: Controlled. - He is advised to continue hydrochlorothiazide 50 minute grams by mouth daily, amlodipine 10 mg by mouth daily.  Follow up plan: Return in about 1 year (around 05/05/2018) for follow up with pre-visit labs.  Marquis Lunch, MD Phone: [phone]  Fax: [phone]   05/05/2017, 10:31 AM

## 2017-10-22 IMAGING — NM NM THYROID IMAGING W/ UPTAKE SINGLE (24 HR)
4 series · 4 of 4 positions shown · non-contrast
Comparison: None

CLINICAL DATA: Hyperthyroidism, 25 pound weight loss in 2 months,
heart palpitations, heat intolerance, abnormal blood work,
hypertension, former smoker

EXAM:
THYROID SCAN AND UPTAKE - 24 HOURS
TECHNIQUE: Following the per oral administration of P-9T9 sodium iodide, the
patient returned at 24 hours and uptake measurements were acquired
with the uptake probe centered on the neck. Thyroid imaging was
performed following the intravenous administration of the 7c-00m
Pertechnetate.
RADIOPHARMACEUTICALS:  13 microCuries P-9T9 sodium iodide orally and
11 mCi 8echnetium-UUm pertechnetate IV

[Series 1: ant w marker · 3.25mm/px · 1 of 1 slices shown]
[im 1/1]
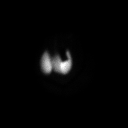

[Series 2: anterior · 3.25mm/px · 1 of 1 slices shown]
[im 1/1]
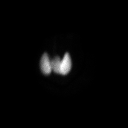

[Series 3: lao · 3.25mm/px · 1 of 1 slices shown]
[im 1/1]
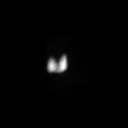

[Series 4: rao · 3.25mm/px · 1 of 1 slices shown]
[im 1/1]
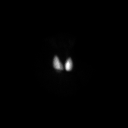

[4 of 4 positions shown; findings below may reference images not displayed]

FINDINGS: Diffusely increased trace localization is throughout both thyroid
lobes.

Pyramidal lobe noted.

No focal areas of increased or decreased tracer localization seen.

24 hour radio iodine uptake calculated at 70.3%, well above the
normal range consistent with hyperthyroidism.
IMPRESSION: Markedly elevated 24 hour radio iodine uptake of 70.3%.

Homogeneous increased tracer distribution throughout both thyroid
lobes.

Findings consistent with Graves disease.

## 2017-11-04 IMAGING — NM NM RAI THERAPY FOR HYPERTHYROIDISM
1 series · 1 of 1 positions shown · non-contrast
Comparison: 02/24/1959

CLINICAL DATA: Hyperthyroidism

EXAM:
RADIOACTIVE IODINE THERAPY FOR HYPERTHYROIDISM
TECHNIQUE: Radioactive iodine prescribed by Padam, Jhemboy. The risks and
benefits of radioactive iodine therapy were discussed with the
patient in detail by Jim, Kaki. Alternative therapies were
also mentioned. Radiation safety was discussed with the patient,
including how to protect the general public from exposure. There
were no barriers to communication. Written consent was obtained. The
patient then received a capsule containing the radiopharmaceutical.
The patient will follow-up with the referring physician.
RADIOPHARMACEUTICALS:  14.7 mCi E-2I2 sodium iodide orally

[Series 1: bone statics · 2.07mm/px · 1 of 1 slices shown]
[im 1/1]
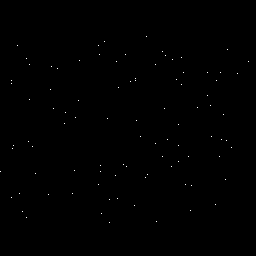

[1 of 1 positions shown; findings below may reference images not displayed]

IMPRESSION: Per oral administration of E-2I2 sodium iodide for the treatment of
hyperthyroidism.

## 2018-05-07 ENCOUNTER — Ambulatory Visit: Payer: BLUE CROSS/BLUE SHIELD | Admitting: "Endocrinology

## 2018-05-30 ENCOUNTER — Other Ambulatory Visit: Payer: Self-pay | Admitting: "Endocrinology

## 2018-06-04 ENCOUNTER — Other Ambulatory Visit: Payer: Self-pay

## 2018-06-04 MED ORDER — LEVOTHYROXINE SODIUM 137 MCG PO TABS: ORAL_TABLET | ORAL | 0 refills | Status: DC

## 2018-06-06 ENCOUNTER — Other Ambulatory Visit: Payer: Self-pay

## 2018-06-06 MED ORDER — LEVOTHYROXINE SODIUM 137 MCG PO TABS: ORAL_TABLET | ORAL | 0 refills | Status: DC

## 2018-06-18 ENCOUNTER — Telehealth: Payer: Self-pay

## 2018-06-18 ENCOUNTER — Telehealth: Payer: Self-pay | Admitting: "Endocrinology

## 2018-06-18 DIAGNOSIS — E890 Postprocedural hypothyroidism: Secondary | ICD-10-CM

## 2018-06-18 NOTE — Telephone Encounter (Signed)
Yes, those should be adequate for now.

## 2018-06-18 NOTE — Telephone Encounter (Signed)
T4 Free, TSH, Vitamin D ordered

## 2018-06-18 NOTE — Telephone Encounter (Signed)
Please send labs to [facility]

## 2018-06-29 ENCOUNTER — Ambulatory Visit: Payer: BLUE CROSS/BLUE SHIELD | Admitting: "Endocrinology

## 2018-07-05 ENCOUNTER — Other Ambulatory Visit: Payer: Self-pay | Admitting: "Endocrinology

## 2018-07-06 LAB — TSH+FREE T4
FREE T4: 1.39 ng/dL (ref 0.82–1.77)
TSH: 5.17 u[IU]/mL — ABNORMAL HIGH (ref 0.450–4.500)

## 2018-07-06 LAB — VITAMIN D 25 HYDROXY (VIT D DEFICIENCY, FRACTURES): VIT D 25 HYDROXY: 9.3 ng/mL — AB (ref 30.0–100.0)

## 2018-07-10 ENCOUNTER — Other Ambulatory Visit: Payer: Self-pay | Admitting: "Endocrinology

## 2018-07-10 ENCOUNTER — Telehealth: Payer: BLUE CROSS/BLUE SHIELD | Admitting: "Endocrinology

## 2018-07-10 MED ORDER — LEVOTHYROXINE SODIUM 150 MCG PO TABS: ORAL_TABLET | ORAL | 0 refills | Status: DC

## 2018-07-10 MED ORDER — VITAMIN D (ERGOCALCIFEROL) 1.25 MG (50000 UNIT) PO CAPS: 50000.0000 [IU] | ORAL_CAPSULE | ORAL | 0 refills | Status: DC

## 2018-08-13 ENCOUNTER — Other Ambulatory Visit: Payer: Self-pay | Admitting: "Endocrinology

## 2018-08-14 ENCOUNTER — Other Ambulatory Visit: Payer: Self-pay

## 2018-08-14 MED ORDER — LEVOTHYROXINE SODIUM 150 MCG PO TABS: ORAL_TABLET | ORAL | 2 refills | Status: DC

## 2018-08-15 ENCOUNTER — Ambulatory Visit (INDEPENDENT_AMBULATORY_CARE_PROVIDER_SITE_OTHER): Payer: BLUE CROSS/BLUE SHIELD | Admitting: "Endocrinology

## 2018-08-15 ENCOUNTER — Encounter: Payer: Self-pay | Admitting: "Endocrinology

## 2018-08-15 ENCOUNTER — Other Ambulatory Visit: Payer: Self-pay

## 2018-08-15 DIAGNOSIS — E890 Postprocedural hypothyroidism: Secondary | ICD-10-CM

## 2018-08-15 DIAGNOSIS — I10 Essential (primary) hypertension: Secondary | ICD-10-CM | POA: Diagnosis not present

## 2018-08-15 DIAGNOSIS — E559 Vitamin D deficiency, unspecified: Secondary | ICD-10-CM | POA: Diagnosis not present

## 2018-08-15 NOTE — Progress Notes (Signed)
Endocrinology Telehealth Visit Follow up Note -During COVID -19 Pandemic  I connected with Darrell Patrick on 08/15/2018   by telephone and verified that I am speaking with the correct person using two identifiers. Darrell Patrick, 1970/09/26. he has verbally consented to this visit. All issues noted in this document were discussed and addressed. The format was not optimal for physical exam.    Subjective:    Patient ID: Darrell Patrick, male    DOB: Sep 08, 1970,    Past Medical History:  Diagnosis Date  . Hypertension   . Thyroid disease    History reviewed. No pertinent surgical history. Social History   Socioeconomic History  . Marital status: Married    Spouse name: Not on file  . Number of children: Not on file  . Years of education: Not on file  . Highest education level: Not on file  Occupational History  . Not on file  Social Needs  . Financial resource strain: Not on file  . Food insecurity:    Worry: Not on file    Inability: Not on file  . Transportation needs:    Medical: Not on file    Non-medical: Not on file  Tobacco Use  . Smoking status: Former Games developer  . Smokeless tobacco: Never Used  Substance and Sexual Activity  . Alcohol use: Yes    Alcohol/week: 0.0 standard drinks    Comment: Social  . Drug use: No  . Sexual activity: Not on file  Lifestyle  . Physical activity:    Days per week: Not on file    Minutes per session: Not on file  . Stress: Not on file  Relationships  . Social connections:    Talks on phone: Not on file    Gets together: Not on file    Attends religious service: Not on file    Active member of club or organization: Not on file    Attends meetings of clubs or organizations: Not on file    Relationship status: Not on file  Other Topics Concern  . Not on file  Social History Narrative  . Not on file   Outpatient Encounter Medications as of 08/15/2018  Medication Sig  . amLODipine (NORVASC) 10 MG  tablet Take 10 mg by mouth daily.  . hydrochlorothiazide (HYDRODIURIL) 50 MG tablet Take 50 mg by mouth daily.  Marland Kitchen levothyroxine (SYNTHROID) 150 MCG tablet TAKE 1 TABLET BY MOUTH ONCE DAILY BEFORE BREAKFAST**OFFICE VISIT WITH LABS NEEDED FOR REFILLS**  . LORazepam (ATIVAN) 1 MG tablet Take 1 mg by mouth every 6 (six) hours as needed for anxiety.  . Vitamin D, Ergocalciferol, (DRISDOL) 1.25 MG (50000 UT) CAPS capsule Take 1 capsule (50,000 Units total) by mouth every 7 (seven) days.   No facility-administered encounter medications on file as of 08/15/2018.    ALLERGIES: No Known Allergies VACCINATION STATUS:  There is no immunization history on file for this patient.  HPI  The patient is a engaged in telehealth to follow-up for  RAI induced hypothyroidism. He is status post  RAI for treatment of Graves' disease.  He is on levothyroxine 137 g by mouth every morning. He reports compliance.  He reports  progressive weight gain ,  denies fatigue . He denies heat/cold intolerance. He received radioactive iodine therapy for Graves' disease on 03/09/2015.  The patient denies family history of thyroid dysfunction  .  patient denies personal history of goiter.  He also has hypertension on hydrochlorothiazide and amlodipine.  Review of systems: Limited as above.   Objective:    There were no vitals taken for this visit.  Wt Readings from Last 3 Encounters:  05/05/17 228 lb (103.4 kg)  03/03/16 221 lb (100.2 kg)  08/28/15 220 lb (99.8 kg)    Recent Results (from the past 2160 hour(s))  TSH + free T4     Status: Abnormal   Collection Time: 07/05/18  4:52 PM  Result Value Ref Range   TSH 5.170 (H) 0.450 - 4.500 uIU/mL   Free T4 1.39 0.82 - 1.77 ng/dL  VITAMIN D 25 Hydroxy (Vit-D Deficiency, Fractures)     Status: Abnormal   Collection Time: 07/05/18  4:52 PM  Result Value Ref Range   Vit D, 25-Hydroxy 9.3 (L) 30.0 - 100.0 ng/mL    Comment: Vitamin D deficiency has been defined by the  Institute of Medicine and an Endocrine Society practice guideline as a level of serum 25-OH vitamin D less than 20 ng/mL (1,2). The Endocrine Society went on to further define vitamin D insufficiency as a level between 21 and 29 ng/mL (2). 1. IOM (Institute of Medicine). 2010. Dietary reference    intakes for calcium and D. [place]: The    [employer]. 2. Holick MF, Binkley [place], Bischoff-Ferrari HA, et al.    Evaluation, treatment, and prevention of vitamin D    deficiency: an Endocrine Society clinical practice    guideline. JCEM. 2011 Jul; 96(7):1911-30.     Labs : 1)  02/26/2016 showed free T4 1.48, TSH 2.46 2) 08/10/2015 Testosterone 346 g  Assessment & Plan:   1.  RAI  hypothyroidism  - He received radioactive iodine thyroid ablation for Graves' disease in November 2017.  - His previsit thyroid function tests are consistent with inadequate replacement.  He be considered for a higher dose of levothyroxine.  I discussed and prescribed levothyroxine 150 mcg p.o. daily before breakfast.      - We discussed about the correct intake of his thyroid hormone, on empty stomach at fasting, with water, separated by at least 30 minutes from breakfast and other medications,  and separated by more than 4 hours from calcium, iron, multivitamins, acid reflux medications (PPIs). -Patient is made aware of the fact that thyroid hormone replacement is needed for life, dose to be adjusted by periodic monitoring of thyroid function tests.   2. Hypertension: he is advised to home monitor blood pressure and report if > 140/90 on 2 separate readings.  - He is advised to continue hydrochlorothiazide 50 minute grams by mouth daily, amlodipine 10 mg by mouth daily.  - Time spent with the patient: 25 min, of which >50% was spent in reviewing his  current and  previous labs/studies, previous treatments, and medications doses and developing a plan for long-term care . Darrell Patrick  participated in the discussions, expressed understanding, and voiced agreement with the above plans.  All questions were answered to his satisfaction. he is encouraged to contact clinic should he have any questions or concerns prior to his return visit.  Follow up plan: Return in about 6 months (around 02/14/2019) for Follow up with Pre-visit Labs.    Marquis Lunch, MD Phone: [phone]  Fax: [phone]   08/15/2018, 4:03 PM

## 2018-11-12 ENCOUNTER — Other Ambulatory Visit: Payer: Self-pay | Admitting: "Endocrinology

## 2019-02-14 ENCOUNTER — Ambulatory Visit: Payer: BC Managed Care – PPO | Admitting: "Endocrinology

## 2019-02-14 ENCOUNTER — Other Ambulatory Visit: Payer: Self-pay

## 2019-02-20 ENCOUNTER — Other Ambulatory Visit: Payer: Self-pay | Admitting: "Endocrinology

## 2019-03-27 ENCOUNTER — Other Ambulatory Visit: Payer: Self-pay | Admitting: "Endocrinology

## 2019-07-04 ENCOUNTER — Encounter: Payer: Self-pay | Admitting: "Endocrinology

## 2019-07-06 ENCOUNTER — Other Ambulatory Visit: Payer: Self-pay | Admitting: "Endocrinology

## 2019-07-10 ENCOUNTER — Telehealth: Payer: Self-pay | Admitting: "Endocrinology

## 2019-07-10 ENCOUNTER — Other Ambulatory Visit: Payer: Self-pay | Admitting: "Endocrinology

## 2019-07-10 MED ORDER — LEVOTHYROXINE SODIUM 150 MCG PO TABS: ORAL_TABLET | ORAL | 0 refills | Status: DC

## 2019-07-10 NOTE — Telephone Encounter (Signed)
Pt requesting refill on levothyroxine (SYNTHROID) 150 MCG tablet. Patient went to PMD and his thyroid 7.1. I have the results in brown bin.

## 2019-07-10 NOTE — Telephone Encounter (Signed)
I will send a refill x 30 days , has to keep his appointment.

## 2019-07-26 ENCOUNTER — Ambulatory Visit: Payer: BC Managed Care – PPO | Admitting: "Endocrinology

## 2019-08-09 ENCOUNTER — Other Ambulatory Visit: Payer: Self-pay | Admitting: "Endocrinology

## 2019-08-12 ENCOUNTER — Other Ambulatory Visit: Payer: Self-pay | Admitting: "Endocrinology

## 2019-08-12 DIAGNOSIS — E890 Postprocedural hypothyroidism: Secondary | ICD-10-CM

## 2019-08-12 MED ORDER — LEVOTHYROXINE SODIUM 150 MCG PO TABS: ORAL_TABLET | ORAL | 0 refills | Status: DC

## 2019-08-12 NOTE — Telephone Encounter (Signed)
Patient is requesting refill on thyroid- patient did not keep last appt, as asked. I am calling to get the lab results because they are not in brown bin. He is scheduled for 5/12

## 2019-08-12 NOTE — Telephone Encounter (Signed)
He will a new set of labs before his visit, will do 30 days refill.

## 2019-08-12 NOTE — Telephone Encounter (Signed)
Called pt left VM, tried to reach patient's wife, no answer no VM. Patient needs new set of labs. Mailed that to pt.

## 2019-08-28 ENCOUNTER — Ambulatory Visit: Payer: BC Managed Care – PPO | Admitting: "Endocrinology

## 2019-09-17 ENCOUNTER — Other Ambulatory Visit: Payer: Self-pay | Admitting: "Endocrinology

## 2019-09-20 ENCOUNTER — Telehealth: Payer: Self-pay | Admitting: "Endocrinology

## 2019-09-23 ENCOUNTER — Other Ambulatory Visit: Payer: Self-pay

## 2019-09-23 DIAGNOSIS — E890 Postprocedural hypothyroidism: Secondary | ICD-10-CM

## 2019-09-23 MED ORDER — LEVOTHYROXINE SODIUM 150 MCG PO TABS: ORAL_TABLET | ORAL | 0 refills | Status: DC

## 2019-09-23 NOTE — Telephone Encounter (Signed)
Patient wife called and states he is completely out of this medicine and his next appt is 6/22

## 2019-10-04 ENCOUNTER — Other Ambulatory Visit: Payer: Self-pay | Admitting: "Endocrinology

## 2019-10-05 LAB — TSH+FREE T4
Free T4: 1.33 ng/dL (ref 0.82–1.77)
TSH: 10.2 u[IU]/mL — ABNORMAL HIGH (ref 0.450–4.500)

## 2019-10-08 ENCOUNTER — Ambulatory Visit: Payer: BC Managed Care – PPO | Admitting: "Endocrinology

## 2019-11-05 ENCOUNTER — Other Ambulatory Visit: Payer: Self-pay | Admitting: "Endocrinology

## 2019-11-05 DIAGNOSIS — E890 Postprocedural hypothyroidism: Secondary | ICD-10-CM

## 2019-11-12 ENCOUNTER — Encounter: Payer: Self-pay | Admitting: "Endocrinology

## 2019-11-12 ENCOUNTER — Other Ambulatory Visit: Payer: Self-pay

## 2019-11-12 ENCOUNTER — Ambulatory Visit: Payer: BC Managed Care – PPO | Admitting: "Endocrinology

## 2019-11-12 VITALS — BP 126/84 | HR 76 | Ht 69.0 in | Wt 234.0 lb

## 2019-11-12 DIAGNOSIS — E890 Postprocedural hypothyroidism: Secondary | ICD-10-CM | POA: Diagnosis not present

## 2019-11-12 MED ORDER — LEVOTHYROXINE SODIUM 175 MCG PO TABS: ORAL_TABLET | ORAL | 1 refills | Status: DC

## 2019-11-12 NOTE — Progress Notes (Signed)
11/12/2019  Endocrinology follow-up note    Subjective:    Patient ID: Darrell Patrick, male    DOB: Nov 07, 1970,    Past Medical History:  Diagnosis Date  . Hypertension   . Thyroid disease    History reviewed. No pertinent surgical history. Social History   Socioeconomic History  . Marital status: Married    Spouse name: Not on file  . Number of children: Not on file  . Years of education: Not on file  . Highest education level: Not on file  Occupational History  . Not on file  Tobacco Use  . Smoking status: Former Games developer  . Smokeless tobacco: Never Used  Substance and Sexual Activity  . Alcohol use: Yes    Alcohol/week: 0.0 standard drinks    Comment: Social  . Drug use: No  . Sexual activity: Not on file  Other Topics Concern  . Not on file  Social History Narrative  . Not on file   Social Determinants of Health   Financial Resource Strain:   . Difficulty of Paying Living Expenses:   Food Insecurity:   . Worried About Programme researcher, broadcasting/film/video in the Last Year:   . Barista in the Last Year:   Transportation Needs:   . Freight forwarder (Medical):   Marland Kitchen Lack of Transportation (Non-Medical):   Physical Activity:   . Days of Exercise per Week:   . Minutes of Exercise per Session:   Stress:   . Feeling of Stress :   Social Connections:   . Frequency of Communication with Friends and Family:   . Frequency of Social Gatherings with Friends and Family:   . Attends Religious Services:   . Active Member of Clubs or Organizations:   . Attends Banker Meetings:   Marland Kitchen Marital Status:    Outpatient Encounter Medications as of 11/12/2019  Medication Sig  . amLODipine (NORVASC) 10 MG tablet Take 10 mg by mouth daily.  Marland Kitchen esomeprazole (NEXIUM) 20 MG capsule Take 20 mg by mouth daily.  . hydrochlorothiazide (HYDRODIURIL) 50 MG tablet Take 50 mg by mouth daily.  Marland Kitchen levothyroxine (SYNTHROID) 175 MCG tablet TAKE 1 TABLET BY  MOUTH ONCE DAILY AS DIRECTED  . LORazepam (ATIVAN) 1 MG tablet Take 1 mg by mouth every 6 (six) hours as needed for anxiety.  . [DISCONTINUED] levothyroxine (SYNTHROID) 150 MCG tablet TAKE 1 TABLET BY MOUTH ONCE DAILY AS DIRECTED  . [DISCONTINUED] Vitamin D, Ergocalciferol, (DRISDOL) 1.25 MG (50000 UT) CAPS capsule Take 1 capsule (50,000 Units total) by mouth every 7 (seven) days.   No facility-administered encounter medications on file as of 11/12/2019.   ALLERGIES: No Known Allergies VACCINATION STATUS:  There is no immunization history on file for this patient.  HPI  The patient is a engaged in telehealth to follow-up for  RAI induced hypothyroidism. He is status post  RAI for treatment of Graves' disease.  He is on levothyroxine 150 g by mouth every morning. He reports compliance.  He missed his last appointment. He reports  progressive weight gain ,  denies fatigue . He denies heat/cold intolerance. He received radioactive iodine therapy for Graves' disease on 03/09/2015.  The patient denies family history of thyroid dysfunction  .  His previsit thyroid function tests are consistent with inadequate replacement.  Patient denies personal history of  goiter.  He also has hypertension on hydrochlorothiazide and amlodipine.  Review of systems: Limited as above.   Objective:    BP 126/84   Pulse 76   Ht 5\' 9"  (1.753 m)   Wt (!) 234 lb (106.1 kg)   BMI 34.56 kg/m   Wt Readings from Last 3 Encounters:  11/12/19 (!) 234 lb (106.1 kg)  05/05/17 228 lb (103.4 kg)  03/03/16 221 lb (100.2 kg)    Recent Results (from the past 2160 hour(s))  TSH + free T4     Status: Abnormal   Collection Time: 10/04/19 12:19 PM  Result Value Ref Range   TSH 10.200 (H) 0.450 - 4.500 uIU/mL   Free T4 1.33 0.82 - 1.77 ng/dL    Labs : 1)  10/06/19 showed free T4 1.48, TSH 2.46 2) 08/10/2015 Testosterone 346 g  Assessment & Plan:   1.  RAI  hypothyroidism  - He received radioactive iodine  thyroid ablation for Graves' disease in November 2017.  - His previsit thyroid function tests are distended with inadequate replacement.  I discussed and increase his levothyroxine to 175 mcg p.o. daily before breakfast.    - We discussed about the correct intake of his thyroid hormone, on empty stomach at fasting, with water, separated by at least 30 minutes from breakfast and other medications,  and separated by more than 4 hours from calcium, iron, multivitamins, acid reflux medications (PPIs). -Patient is made aware of the fact that thyroid hormone replacement is needed for life, dose to be adjusted by periodic monitoring of thyroid function tests.    2. Hypertension: His blood pressure is controlled to target.  - He is advised to continue hydrochlorothiazide 50 minute grams by mouth daily, amlodipine 10 mg by mouth daily.     - Time spent on this patient care encounter:  20 minutes of which 50% was spent in  counseling and the rest reviewing  his current and  previous labs / studies and medications  doses and developing a plan for long term care. December 2017  participated in the discussions, expressed understanding, and voiced agreement with the above plans.  All questions were answered to his satisfaction. he is encouraged to contact clinic should he have any questions or concerns prior to his return visit.   Follow up plan: Return in about 6 months (around 05/14/2020) for F/U with Pre-visit Labs, [place] with [facility].    05/16/2020, MD Phone: [phone]  Fax: [phone]   11/12/2019, 10:18 PM

## 2020-03-03 ENCOUNTER — Other Ambulatory Visit: Payer: Self-pay | Admitting: "Endocrinology

## 2020-03-03 DIAGNOSIS — E890 Postprocedural hypothyroidism: Secondary | ICD-10-CM

## 2020-05-12 LAB — T4, FREE: Free T4: 1.5 ng/dL (ref 0.82–1.77)

## 2020-05-12 LAB — TSH: TSH: 3.31 u[IU]/mL (ref 0.450–4.500)

## 2020-05-14 ENCOUNTER — Ambulatory Visit: Payer: BC Managed Care – PPO | Admitting: Nurse Practitioner

## 2020-05-14 NOTE — Patient Instructions (Signed)

## 2020-05-15 ENCOUNTER — Encounter: Payer: Self-pay | Admitting: Nurse Practitioner

## 2020-05-15 ENCOUNTER — Ambulatory Visit (INDEPENDENT_AMBULATORY_CARE_PROVIDER_SITE_OTHER): Payer: BC Managed Care – PPO | Admitting: Nurse Practitioner

## 2020-05-15 VITALS — BP 138/88 | HR 87 | Ht 69.0 in | Wt 227.4 lb

## 2020-05-15 DIAGNOSIS — E559 Vitamin D deficiency, unspecified: Secondary | ICD-10-CM | POA: Diagnosis not present

## 2020-05-15 DIAGNOSIS — I10 Essential (primary) hypertension: Secondary | ICD-10-CM | POA: Diagnosis not present

## 2020-05-15 DIAGNOSIS — E890 Postprocedural hypothyroidism: Secondary | ICD-10-CM

## 2020-05-15 MED ORDER — LEVOTHYROXINE SODIUM 175 MCG PO TABS
175.0000 ug | ORAL_TABLET | Freq: Every day | ORAL | 3 refills | Status: DC
Start: 2020-05-15 — End: 2021-04-29

## 2020-05-15 MED ORDER — LEVOTHYROXINE SODIUM 175 MCG PO TABS: 175.0000 ug | ORAL_TABLET | Freq: Every day | ORAL | 0 refills | Status: DC

## 2020-05-15 NOTE — Progress Notes (Signed)
05/15/2020  Endocrinology follow-up note    Subjective:    Patient ID: Darrell Patrick, male    DOB: December 01, 1970,    Past Medical History:  Diagnosis Date  . Hypertension   . Thyroid disease    History reviewed. No pertinent surgical history. Social History   Socioeconomic History  . Marital status: Married    Spouse name: Not on file  . Number of children: Not on file  . Years of education: Not on file  . Highest education level: Not on file  Occupational History  . Not on file  Tobacco Use  . Smoking status: Former Games developer  . Smokeless tobacco: Never Used  Substance and Sexual Activity  . Alcohol use: Yes    Alcohol/week: 0.0 standard drinks    Comment: Social  . Drug use: No  . Sexual activity: Not on file  Other Topics Concern  . Not on file  Social History Narrative  . Not on file   Social Determinants of Health   Financial Resource Strain: Not on file  Food Insecurity: Not on file  Transportation Needs: Not on file  Physical Activity: Not on file  Stress: Not on file  Social Connections: Not on file   Outpatient Encounter Medications as of 05/15/2020  Medication Sig  . amLODipine (NORVASC) 10 MG tablet Take 10 mg by mouth daily.  Marland Kitchen esomeprazole (NEXIUM) 20 MG capsule Take 20 mg by mouth daily.  . hydrochlorothiazide (HYDRODIURIL) 50 MG tablet Take 50 mg by mouth daily.  Marland Kitchen levothyroxine (SYNTHROID) 175 MCG tablet TAKE 1 TABLET BY MOUTH ONCE DAILY AS DIRECTED  . LORazepam (ATIVAN) 1 MG tablet Take 1 mg by mouth every 6 (six) hours as needed for anxiety.  . sildenafil (VIAGRA) 100 MG tablet Take by mouth.   No facility-administered encounter medications on file as of 05/15/2020.   ALLERGIES: No Known Allergies VACCINATION STATUS:  There is no immunization history on file for this patient.  HPI Thyroid Problem Presents for follow-up visit. Patient reports no anxiety, cold intolerance, constipation, depressed mood, diarrhea,  fatigue, heat intolerance, palpitations, tremors, weight gain or weight loss. The symptoms have been stable.    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  He is on levothyroxine 175 g by mouth every morning. He reports compliance.    The patient denies family history of thyroid dysfunction.  Patient denies personal history of goiter.  He also has hypertension on propanolol, hydrochlorothiazide and amlodipine.  Review of systems  Constitutional: + Minimally fluctuating body weight,  current Body mass index is 33.58 kg/m. , no fatigue, no subjective hyperthermia, no subjective hypothermia Eyes: no blurry vision, no xerophthalmia ENT: no sore throat, no nodules palpated in throat, no dysphagia/odynophagia, no hoarseness Cardiovascular: no chest pain, no shortness of breath, no palpitations, no leg swelling Respiratory: no cough, no shortness of breath Gastrointestinal: no nausea/vomiting/diarrhea Musculoskeletal: no muscle/joint aches Skin: no rashes, no hyperemia Neurological: no tremors, no numbness, no tingling, no dizziness Psychiatric: no depression, no anxiety   Objective:    BP 138/88 (BP Location: Left Arm, Patient Position: Sitting)   Pulse 87   Ht 5\' 9"  (1.753 m)   Wt 227 lb 6.4 oz (103.1 kg)   BMI 33.58 kg/m   Wt Readings from  Last 3 Encounters:  05/15/20 227 lb 6.4 oz (103.1 kg)  11/12/19 (!) 234 lb (106.1 kg)  05/05/17 228 lb (103.4 kg)    BP Readings from Last 3 Encounters:  05/15/20 138/88  11/12/19 126/84  05/05/17 134/89     Physical Exam- Limited  Constitutional:  Body mass index is 33.58 kg/m. , not in acute distress, normal state of mind Eyes:  EOMI, no exophthalmos Neck: Supple Thyroid: No gross goiter Cardiovascular: RRR, no murmers, rubs, or gallops, no edema Respiratory: Adequate breathing efforts, no crackles, rales, rhonchi, or  wheezing Musculoskeletal: no gross deformities, strength intact in all four extremities, no gross restriction of joint movements Skin:  no rashes, no hyperemia Neurological: no tremor with outstretched hands  Recent Results (from the past 2160 hour(s))  TSH     Status: None   Collection Time: 05/11/20  3:13 PM  Result Value Ref Range   TSH 3.310 0.450 - 4.500 uIU/mL  T4, free     Status: None   Collection Time: 05/11/20  3:13 PM  Result Value Ref Range   Free T4 1.50 0.82 - 1.77 ng/dL      Assessment & Plan:   1.  RAI  hypothyroidism  - He received radioactive iodine thyroid ablation for Graves' disease in November 2017.   -His previsit TFTs are consistent with appropriate hormone replacement.  He is advised to continue Levothyroxine 175 mcg po daily before breakfast.    - We discussed about the correct intake of his thyroid hormone, on empty stomach at fasting, with water, separated by at least 30 minutes from breakfast and other medications,  and separated by more than 4 hours from calcium, iron, multivitamins, acid reflux medications (PPIs). -Patient is made aware of the fact that thyroid hormone replacement is needed for life, dose to be adjusted by periodic monitoring of thyroid function tests.  2. Vitamin D deficiency His most recent Vitamin D level from 07/05/2018 was 9.3.  He is not currently on any supplementation.  Will consider rechecking vitamin D level on subsequent visits.     - Time spent on this patient care encounter:  20 minutes of which 50% was spent in  counseling and the rest reviewing  his current and  previous labs / studies and medications  doses and developing a plan for long term care. Darrell Patrick  participated in the discussions, expressed understanding, and voiced agreement with the above plans.  All questions were answered to his satisfaction. he is encouraged to contact clinic should he have any questions or concerns prior to his return  visit.   Follow up plan: Return in about 6 months (around 11/12/2020) for Thyroid follow up, Previsit labs.    Ronny Bacon, [facility] [address] [place], [place] [address] Phone: [phone] Fax: [phone]  05/15/2020, 10:27 AM

## 2020-05-15 NOTE — Addendum Note (Signed)
Addended by: Dani Gobble on: 05/15/2020 10:41 AM   Modules accepted: Orders

## 2020-09-19 ENCOUNTER — Other Ambulatory Visit: Payer: Self-pay

## 2020-09-19 ENCOUNTER — Emergency Department (HOSPITAL_COMMUNITY): Admission: EM | Admit: 2020-09-19 | Payer: Self-pay | Source: Home / Self Care

## 2020-11-06 LAB — T4, FREE: Free T4: 1.65 ng/dL (ref 0.82–1.77)

## 2020-11-06 LAB — TSH: TSH: 1.34 u[IU]/mL (ref 0.450–4.500)

## 2020-11-11 NOTE — Patient Instructions (Signed)

## 2020-11-12 ENCOUNTER — Other Ambulatory Visit: Payer: Self-pay

## 2020-11-12 ENCOUNTER — Ambulatory Visit (INDEPENDENT_AMBULATORY_CARE_PROVIDER_SITE_OTHER): Payer: No Typology Code available for payment source | Admitting: Nurse Practitioner

## 2020-11-12 ENCOUNTER — Encounter: Payer: Self-pay | Admitting: Nurse Practitioner

## 2020-11-12 VITALS — BP 129/83 | HR 84 | Ht 69.0 in | Wt 235.6 lb

## 2020-11-12 DIAGNOSIS — E890 Postprocedural hypothyroidism: Secondary | ICD-10-CM

## 2020-11-12 DIAGNOSIS — I10 Essential (primary) hypertension: Secondary | ICD-10-CM | POA: Diagnosis not present

## 2020-11-12 DIAGNOSIS — E559 Vitamin D deficiency, unspecified: Secondary | ICD-10-CM | POA: Diagnosis not present

## 2020-11-12 NOTE — Progress Notes (Signed)
11/12/2020  Endocrinology follow-up note    Subjective:    Patient ID: Darrell Patrick, male    DOB: July 11, 1970,    Past Medical History:  Diagnosis Date   Hypertension    Thyroid disease    History reviewed. No pertinent surgical history. Social History   Socioeconomic History   Marital status: Married    Spouse name: Not on file   Number of children: Not on file   Years of education: Not on file   Highest education level: Not on file  Occupational History   Not on file  Tobacco Use   Smoking status: Former   Smokeless tobacco: Never  Vaping Use   Vaping Use: Never used  Substance and Sexual Activity   Alcohol use: Yes    Alcohol/week: 0.0 standard drinks    Comment: Social   Drug use: No   Sexual activity: Not on file  Other Topics Concern   Not on file  Social History Narrative   Not on file   Social Determinants of Health   Financial Resource Strain: Not on file  Food Insecurity: Not on file  Transportation Needs: Not on file  Physical Activity: Not on file  Stress: Not on file  Social Connections: Not on file   Outpatient Encounter Medications as of 11/12/2020  Medication Sig   amLODipine (NORVASC) 10 MG tablet Take 10 mg by mouth daily.   esomeprazole (NEXIUM) 20 MG capsule Take 20 mg by mouth daily.   hydrochlorothiazide (HYDRODIURIL) 50 MG tablet Take 50 mg by mouth daily.   levothyroxine (SYNTHROID) 175 MCG tablet Take 1 tablet (175 mcg total) by mouth daily. as directed   LORazepam (ATIVAN) 1 MG tablet Take 1 mg by mouth every 6 (six) hours as needed for anxiety.   sildenafil (VIAGRA) 100 MG tablet Take by mouth.   No facility-administered encounter medications on file as of 11/12/2020.   ALLERGIES: No Known Allergies VACCINATION STATUS:  There is no immunization history on file for this patient.  HPI Thyroid Problem Presents for follow-up visit. Symptoms include fatigue. Patient reports no anxiety, cold  intolerance, constipation, depressed mood, diarrhea, heat intolerance, leg swelling, palpitations, tremors, weight gain or weight loss. The symptoms have been stable.   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  He is on Levothyroxine 175 g by mouth every morning. He reports compliance.    The patient denies family history of thyroid dysfunction.  Patient denies personal history of goiter.  He also has hypertension on propanolol, hydrochlorothiazide and amlodipine.  Review of systems  Constitutional: + steadily increasing body body weight,  current Body mass index is 34.79 kg/m. , + fatigue, no subjective hyperthermia, no subjective hypothermia Eyes: no blurry vision, no xerophthalmia ENT: no sore throat, no nodules palpated in throat, no dysphagia/odynophagia, no hoarseness Cardiovascular: no chest pain, no shortness of breath, no palpitations, no leg swelling Respiratory: no cough, no shortness of breath Gastrointestinal: no nausea/vomiting/diarrhea Musculoskeletal: no muscle/joint aches Skin: no rashes, no hyperemia Neurological: no tremors, no numbness, no tingling, no dizziness Psychiatric: no depression, no anxiety   Objective:    BP 129/83   Pulse 84   Ht 5\' 9"  (1.753 m)   Wt 235 lb 9.6 oz (106.9 kg)   BMI 34.79  kg/m   Wt Readings from Last 3 Encounters:  11/12/20 235 lb 9.6 oz (106.9 kg)  05/15/20 227 lb 6.4 oz (103.1 kg)  11/12/19 (!) 234 lb (106.1 kg)    BP Readings from Last 3 Encounters:  11/12/20 129/83  05/15/20 138/88  11/12/19 126/84     Physical Exam- Limited  Constitutional:  Body mass index is 34.79 kg/m. , not in acute distress, normal state of mind Eyes:  EOMI, no exophthalmos Neck: Supple Cardiovascular: RRR, no murmurs, rubs, or gallops, no edema Respiratory: Adequate breathing efforts, no crackles, rales, rhonchi, or wheezing Musculoskeletal:  no gross deformities, strength intact in all four extremities, no gross restriction of joint movements Skin:  no rashes, no hyperemia Neurological: no tremor with outstretched hands    Recent Results (from the past 2160 hour(s))  TSH     Status: None   Collection Time: 11/05/20  2:37 PM  Result Value Ref Range   TSH 1.340 0.450 - 4.500 uIU/mL  T4, free     Status: None   Collection Time: 11/05/20  2:37 PM  Result Value Ref Range   Free T4 1.65 0.82 - 1.77 ng/dL      Assessment & Plan:   1.  RAI  hypothyroidism  - He received radioactive iodine thyroid ablation for Graves' disease in November 2017.   -His previsit thyroid function tests are consistent with appropriate hormone replacement.  He is advised to continue Levothyroxine 175 mcg po daily before breakfast.      - We discussed about the correct intake of his thyroid hormone, on empty stomach at fasting, with water, separated by at least 30 minutes from breakfast and other medications,  and separated by more than 4 hours from calcium, iron, multivitamins, acid reflux medications (PPIs). -Patient is made aware of the fact that thyroid hormone replacement is needed for life, dose to be adjusted by periodic monitoring of thyroid function tests.  2. Vitamin D deficiency His most recent Vitamin D level from 07/05/2018 was 9.3.  He is not currently on any supplementation.  Will recheck vitamin D prior to next visit.    I spent 20 minutes in the care of the patient today including review of labs from Thyroid Function, CMP, and other relevant labs ; imaging/biopsy records (current and previous including abstractions from other facilities); face-to-face time discussing  his lab results and symptoms, medications doses, his options of short and long term treatment based on the latest standards of care / guidelines;   and documenting the encounter.  Darrell Patrick  participated in the discussions, expressed understanding, and voiced  agreement with the above plans.  All questions were answered to his satisfaction. he is encouraged to contact clinic should he have any questions or concerns prior to his return visit.   Follow up plan: Return in about 6 months (around 05/15/2021) for Thyroid follow up, Previsit labs.    Ronny Bacon, [facility] [address] [place], [place] [address] Phone: [phone] Fax: [phone]  11/12/2020, 11:47 AM

## 2021-01-25 ENCOUNTER — Telehealth: Payer: Self-pay

## 2021-01-25 DIAGNOSIS — E890 Postprocedural hypothyroidism: Secondary | ICD-10-CM

## 2021-01-25 NOTE — Telephone Encounter (Signed)
Left VM on Valerie's phone to return my call about getting his labs done and how she would like to proceed with that

## 2021-01-25 NOTE — Telephone Encounter (Signed)
Pt's wife called and said he went to the eye doctor on Thursday. The symptoms were really red and they were hurting. They told her it is related to his thyroid. What would you like me to do? She is asking for a video visit. She will ask them to fax the eye results to [place].  [address] on [address] in [place]

## 2021-01-25 NOTE — Telephone Encounter (Signed)
I went ahead and mailed the lab order for him to complete then he needs an appt

## 2021-01-29 ENCOUNTER — Other Ambulatory Visit: Payer: Self-pay | Admitting: Nurse Practitioner

## 2021-01-30 LAB — T4, FREE: Free T4: 1.76 ng/dL (ref 0.82–1.77)

## 2021-01-30 LAB — TSH: TSH: 0.628 u[IU]/mL (ref 0.450–4.500)

## 2021-02-03 NOTE — Telephone Encounter (Signed)
Pt wife called and wanted the results. She said you can call the patient at [phone]

## 2021-02-03 NOTE — Telephone Encounter (Signed)
I called patient back and discussed the findings from the ophthalmologist.  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  He verbalized understanding.

## 2021-04-28 ENCOUNTER — Other Ambulatory Visit: Payer: Self-pay | Admitting: Nurse Practitioner

## 2021-04-28 DIAGNOSIS — E890 Postprocedural hypothyroidism: Secondary | ICD-10-CM

## 2021-05-17 ENCOUNTER — Ambulatory Visit: Payer: Self-pay | Admitting: Nurse Practitioner

## 2021-05-17 NOTE — Patient Instructions (Incomplete)

## 2021-05-25 ENCOUNTER — Ambulatory Visit: Payer: No Typology Code available for payment source | Admitting: Nurse Practitioner

## 2021-05-25 NOTE — Patient Instructions (Incomplete)

## 2021-05-31 NOTE — Patient Instructions (Signed)

## 2021-06-01 ENCOUNTER — Encounter: Payer: Self-pay | Admitting: Nurse Practitioner

## 2021-06-01 ENCOUNTER — Other Ambulatory Visit: Payer: Self-pay

## 2021-06-01 ENCOUNTER — Ambulatory Visit (INDEPENDENT_AMBULATORY_CARE_PROVIDER_SITE_OTHER): Payer: No Typology Code available for payment source | Admitting: Nurse Practitioner

## 2021-06-01 VITALS — BP 123/79 | HR 104 | Ht 69.0 in | Wt 233.4 lb

## 2021-06-01 DIAGNOSIS — I10 Essential (primary) hypertension: Secondary | ICD-10-CM

## 2021-06-01 DIAGNOSIS — E559 Vitamin D deficiency, unspecified: Secondary | ICD-10-CM | POA: Diagnosis not present

## 2021-06-01 DIAGNOSIS — E890 Postprocedural hypothyroidism: Secondary | ICD-10-CM

## 2021-06-01 LAB — TSH: TSH: 0.437 u[IU]/mL — ABNORMAL LOW (ref 0.450–4.500)

## 2021-06-01 LAB — T4, FREE: Free T4: 1.6 ng/dL (ref 0.82–1.77)

## 2021-06-01 NOTE — Progress Notes (Signed)
06/01/2021  Endocrinology follow-up note    Subjective:    Patient ID: Darrell Patrick, male    DOB: 1970-09-20,    Past Medical History:  Diagnosis Date   Hypertension    Thyroid disease    History reviewed. No pertinent surgical history. Social History   Socioeconomic History   Marital status: Married    Spouse name: Not on file   Number of children: Not on file   Years of education: Not on file   Highest education level: Not on file  Occupational History   Not on file  Tobacco Use   Smoking status: Former   Smokeless tobacco: Never  Vaping Use   Vaping Use: Never used  Substance and Sexual Activity   Alcohol use: Yes    Alcohol/week: 0.0 standard drinks    Comment: Social   Drug use: No   Sexual activity: Not on file  Other Topics Concern   Not on file  Social History Narrative   Not on file   Social Determinants of Health   Financial Resource Strain: Not on file  Food Insecurity: Not on file  Transportation Needs: Not on file  Physical Activity: Not on file  Stress: Not on file  Social Connections: Not on file   Outpatient Encounter Medications as of 06/01/2021  Medication Sig   ALPRAZolam (XANAX) 1 MG tablet Take 1 mg by mouth once.   amLODipine (NORVASC) 10 MG tablet Take 10 mg by mouth daily.   cetirizine (ZYRTEC) 10 MG tablet Take 10 mg by mouth daily.   diclofenac (VOLTAREN) 75 MG EC tablet 1 tablet as needed   diclofenac Sodium (VOLTAREN) 1 % GEL Apply 4 g topically 4 (four) times daily.   dorzolamide-timolol (COSOPT) 22.3-6.8 MG/ML ophthalmic solution 1 drop 2 (two) times daily.   esomeprazole (NEXIUM) 20 MG capsule Take 20 mg by mouth daily.   folic acid (FOLVITE) 1 MG tablet Take 1 mg by mouth daily.   HUMIRA PEN 40 MG/0.4ML PNKT SMARTSIG:40 Milligram(s) SUB-Q Every 2 Weeks   hydrochlorothiazide (HYDRODIURIL) 50 MG tablet Take 50 mg by mouth daily.   levothyroxine (SYNTHROID) 175 MCG tablet TAKE 1 TABLET BY MOUTH ONCE  DAILY AS DIRECTED   LORazepam (ATIVAN) 1 MG tablet Take 1 mg by mouth every 6 (six) hours as needed for anxiety.   meloxicam (MOBIC) 15 MG tablet 1 tablet   methotrexate (RHEUMATREX) 2.5 MG tablet Take 15 mg by mouth once a week.   prednisoLONE acetate (PRED FORTE) 1 % ophthalmic suspension SMARTSIG:In Eye(s)   predniSONE (DELTASONE) 5 MG tablet SMARTSIG:Insert By Mouth Every Morning   sildenafil (VIAGRA) 100 MG tablet Take by mouth.   atropine 1 % ophthalmic solution SMARTSIG:In Eye(s) (Patient not taking: Reported on 06/01/2021)   No facility-administered encounter medications on file as of 06/01/2021.   ALLERGIES: No Known Allergies VACCINATION STATUS:  There is no immunization history on file for this patient.  HPI Thyroid Problem Presents for follow-up visit. Symptoms include fatigue. Patient reports no anxiety, cold intolerance, constipation, depressed mood, diarrhea, heat intolerance, leg swelling, palpitations, tremors, weight gain or weight loss. The symptoms have been stable.   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  He is on Levothyroxine 175 g by mouth every morning. He  reports compliance.    The patient denies family history of thyroid dysfunction.  Patient denies personal history of goiter.  He also has hypertension.  Review of systems  Constitutional: + Minimally fluctuating body weight,  current Body mass index is 34.47 kg/m. , no fatigue, no subjective hyperthermia, no subjective hypothermia Eyes: no blurry vision, no xerophthalmia ENT: no sore throat, no nodules palpated in throat, no dysphagia/odynophagia, no hoarseness Cardiovascular: no chest pain, no shortness of breath, no palpitations, no leg swelling Respiratory: no cough, no shortness of breath Gastrointestinal: no nausea/vomiting/diarrhea Musculoskeletal: no muscle/joint aches Skin: no rashes, no  hyperemia Neurological: no tremors, no numbness, no tingling, no dizziness Psychiatric: no depression, no anxiety   Objective:    BP 123/79    Pulse (!) 104    Ht  (1.753 m)    Wt 233 lb 6.4 oz (105.9 kg)    SpO2 95%    BMI 34.47 kg/m   Wt Readings from Last 3 Encounters:  06/01/21 233 lb 6.4 oz (105.9 kg)  11/12/20 235 lb 9.6 oz (106.9 kg)  05/15/20 227 lb 6.4 oz (103.1 kg)    BP Readings from Last 3 Encounters:  06/01/21 123/79  11/12/20 129/83  05/15/20 138/88     Physical Exam- Limited  Constitutional:  Body mass index is 34.47 kg/m. , not in acute distress, normal state of mind Eyes:  EOMI, no exophthalmos Neck: Supple Cardiovascular: RRR, no murmurs, rubs, or gallops, no edema Respiratory: Adequate breathing efforts, no crackles, rales, rhonchi, or wheezing Musculoskeletal: no gross deformities, strength intact in all four extremities, no gross restriction of joint movements Skin:  no rashes, no hyperemia Neurological: no tremor with outstretched hands    Recent Results (from the past 2160 hour(s))  TSH     Status: Abnormal   Collection Time: 05/31/21  4:42 PM  Result Value Ref Range   TSH 0.437 (L) 0.450 - 4.500 uIU/mL  T4, free     Status: None   Collection Time: 05/31/21  4:42 PM  Result Value Ref Range   Free T4 1.60 0.82 - 1.77 ng/dL     Latest Reference Range & Units 10/04/19 12:19 05/11/20 15:13 11/05/20 14:37 01/29/21 11:13 05/31/21 16:42  TSH 0.450 - 4.500 uIU/mL 10.200 (H) 3.310 1.340 0.628 0.437 (L)  T4,Free(Direct) 0.82 - 1.77 ng/dL 1.61 0.96 0.45 4.09 8.11  (H): Data is abnormally high (L): Data is abnormally low  Assessment & Plan:   1.  RAI  hypothyroidism  - He received radioactive iodine thyroid ablation for Graves' disease in November 2017.     -His previsit thyroid function tests are consistent with appropriate hormone replacement.  He is advised to continue Levothyroxine 175 mcg po daily before breakfast.    - We discussed  about the correct intake of his thyroid hormone, on empty stomach at fasting, with water, separated by at least 30 minutes from breakfast and other medications,  and separated by more than 4 hours from calcium, iron, multivitamins, acid reflux medications (PPIs). -Patient is made aware of the fact that thyroid hormone replacement is needed for life, dose to be adjusted by periodic monitoring of thyroid function tests.  2. Vitamin D deficiency His most recent Vitamin D level from 07/05/2018 was 9.3.  He is not currently on any supplementation.   Will recheck vitamin D prior to next visit.    I spent 20 minutes in the care of the patient today including review of labs from Thyroid Function, CMP, and  other relevant labs ; imaging/biopsy records (current and previous including abstractions from other facilities); face-to-face time discussing  his lab results and symptoms, medications doses, his options of short and long term treatment based on the latest standards of care / guidelines;   and documenting the encounter.  Darrell Shows  participated in the discussions, expressed understanding, and voiced agreement with the above plans.  All questions were answered to his satisfaction. he is encouraged to contact clinic should he have any questions or concerns prior to his return visit.   Follow up plan: Return in about 1 year (around 06/01/2022) for Thyroid follow up, Previsit labs.    Ronny Bacon, [facility] [address] [place], [place] [address] Phone: [phone] Fax: [phone]  06/01/2021, 1:51 PM

## 2021-09-09 ENCOUNTER — Telehealth: Payer: Self-pay | Admitting: Nurse Practitioner

## 2021-09-09 NOTE — Telephone Encounter (Signed)
[facility] [place] [facility]. Faxed and sent the request to [facility] and scanned to chart

## 2021-10-14 ENCOUNTER — Encounter: Payer: Self-pay | Admitting: Physical Medicine & Rehabilitation

## 2021-11-18 ENCOUNTER — Encounter
Payer: No Typology Code available for payment source | Attending: Physical Medicine & Rehabilitation | Admitting: Physical Medicine & Rehabilitation

## 2021-11-18 ENCOUNTER — Encounter: Payer: Self-pay | Admitting: Physical Medicine & Rehabilitation

## 2021-11-18 VITALS — BP 132/90 | HR 84 | Ht 69.0 in | Wt 235.0 lb

## 2021-11-18 DIAGNOSIS — G8929 Other chronic pain: Secondary | ICD-10-CM | POA: Insufficient documentation

## 2021-11-18 DIAGNOSIS — M542 Cervicalgia: Secondary | ICD-10-CM | POA: Diagnosis present

## 2021-11-18 DIAGNOSIS — M5441 Lumbago with sciatica, right side: Secondary | ICD-10-CM | POA: Diagnosis present

## 2021-11-18 DIAGNOSIS — M0609 Rheumatoid arthritis without rheumatoid factor, multiple sites: Secondary | ICD-10-CM | POA: Diagnosis present

## 2021-11-18 MED ORDER — DULOXETINE HCL 30 MG PO CPEP: 30.0000 mg | ORAL_CAPSULE | Freq: Every day | ORAL | 3 refills | Status: DC

## 2021-11-18 MED ORDER — CYCLOBENZAPRINE HCL 5 MG PO TABS: 5.0000 mg | ORAL_TABLET | Freq: Three times a day (TID) | ORAL | 0 refills | Status: DC | PRN

## 2021-11-18 NOTE — Progress Notes (Signed)
Subjective:    Patient ID: Darrell Patrick, male    DOB: 1970/07/13, 51 y.o.   MRN: [id number]  HPI Darrell Patrick is a past medical history of hypertension, hypothyroidism status post radioactive iodine, rheumatoid arthritis who is here for his chronic lower back pain.  Patient reports he has had chronic low back pain for many years.  He suspects the pain is related to his prior work at [employer] where he had to do a lot of heavy lifting.  He reports he stopped working at this job May 2022.  He developed sciatica around 2008 with shooting pain down his right lower extremity down to his lateral foot.  He reports he felt a popping sensation when he was walking before this pain began. He is not able to recall what work-up was done for his sciatica or any treatments that were attempted for this issue.  He reports the sciatica has improved but he continues to have numbness in the distribution of this pain.  He continues to have sharp burning and aching pain in his lower back.  He also has less severe pain in his neck and thoracic spine.  Additionally, he has chronic pain in most of the joints throughout his body suspected to be related to his rheumatoid arthritis.  He is followed by rheumatology and is on Humira.  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   He reports medications for HA have not significantly reduced his overall pain.  Ibuprofen helps his pain slightly.  He has not tried any other pain medications previously.  He denies any history of kidney or liver disease.   Pain Inventory Average Pain 7 Pain Right Now 7 My pain is constant, sharp, burning, and aching  In the last 24 hours, has pain interfered with the following? General activity 7 Relation with others 7 Enjoyment of life 7 What TIME of day is your pain at its worst? morning  and varies Sleep (in general) Poor  Pain is worse with: walking, standing, and some activites Pain improves with:  nothing Relief from Meds:   na  walk without assistance how many minutes can you walk? 10 ability to climb steps?  yes do you drive?  yes  not employed: date last employed .  weakness numbness spasms depression anxiety  New pt  New pt    No family history on file. Social History   Socioeconomic History   Marital status: Married    Spouse name: Not on file   Number of children: Not on file   Years of education: Not on file   Highest education level: Not on file  Occupational History   Not on file  Tobacco Use   Smoking status: Former   Smokeless tobacco: Never  Vaping Use   Vaping Use: Never used  Substance and Sexual Activity   Alcohol use: Yes    Alcohol/week: 0.0 standard drinks of alcohol    Comment: Social   Drug use: No   Sexual activity: Not on file  Other Topics Concern   Not on file  Social History Narrative   Not on file   Social Determinants of Health   Financial Resource Strain: Not on file  Food Insecurity: Not on file  Transportation Needs: Not on file  Physical Activity: Not on file  Stress: Not on file  Social Connections: Not on file   No past surgical history on file. Past Medical History:  Diagnosis Date  Hypertension    Thyroid disease    BP (!) 132/90   Pulse 84   Ht 5\' 9"  (1.753 m)   Wt 235 lb (106.6 kg)   SpO2 94%   BMI 34.70 kg/m   Opioid Risk Score:   Fall Risk Score:  `1  Depression screen [facility] 2/9     11/18/2021   10:51 AM 05/05/2017   10:23 AM 08/28/2015   11:07 AM 05/08/2015    2:54 PM 02/13/2015    8:43 AM  Depression screen PHQ 2/9  Decreased Interest 2 0 0 0 0  Down, Depressed, Hopeless 2 0 0 0 0  PHQ - 2 Score 4 0 0 0 0  Altered sleeping 3      Tired, decreased energy 3      Change in appetite 2      Feeling bad or failure about yourself  2      Trouble concentrating 1      Moving slowly or fidgety/restless 1      Suicidal thoughts 0      PHQ-9 Score 16      Difficult doing work/chores Somewhat difficult         Review  of Systems  Musculoskeletal:  Positive for back pain and neck pain.       Pain in right buttocks going to the foot  Neurological:  Positive for weakness and numbness.  Psychiatric/Behavioral:  Positive for dysphoric mood. The patient is nervous/anxious.   All other systems reviewed and are negative.      Objective:   Physical Exam  Gen: no distress, normal appearing HEENT: oral mucosa pink and moist, NCAT Cardio: Reg rate Chest: normal effort, normal rate of breathing Abd: soft, non-distended Ext: no edema Psych: pleasant, normal affect Skin: intact Neuro: Alert and oriented x4, follows commands, cranial nerves II through XII grossly intact, sensation intact to light touch in the bilateral upper extremities and left lower extremity, he has decreased sensation over his posterior lateral leg and lateral foot Strength 5 out of 5 in all 4 extremities throughout musculoskeletal:  Lower spine paraspinal tenderness, he also has mild tenderness at C-spine and T-spine paraspinals Patient reports tenderness at multiple joints including bilateral ankles, knees, hips, elbows, shoulders, wrists, fingers Facet loading positive L-spine SLR negative b/l FABER, FAIR test caused hip pain b/l Very limited lumbar spinal motion in all directions No significant Joint deformity noted      Assessment & Plan:   Chronic lower back pain with R radicular pain in S1 distribution -L spine xray for further evaluation -Start duloxetine 30mg  daily  -Home exercise program with core strengthening -Continue ibuprofen prn  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

## 2021-12-13 ENCOUNTER — Other Ambulatory Visit: Payer: Self-pay | Admitting: Nurse Practitioner

## 2021-12-13 DIAGNOSIS — E890 Postprocedural hypothyroidism: Secondary | ICD-10-CM

## 2022-01-07 ENCOUNTER — Encounter
Payer: No Typology Code available for payment source | Attending: Physical Medicine & Rehabilitation | Admitting: Physical Medicine & Rehabilitation

## 2022-01-07 ENCOUNTER — Encounter: Payer: Self-pay | Admitting: Physical Medicine & Rehabilitation

## 2022-01-07 VITALS — BP 137/91 | HR 89 | Ht 69.0 in | Wt 229.8 lb

## 2022-01-07 DIAGNOSIS — G8929 Other chronic pain: Secondary | ICD-10-CM

## 2022-01-07 DIAGNOSIS — M069 Rheumatoid arthritis, unspecified: Secondary | ICD-10-CM | POA: Diagnosis present

## 2022-01-07 DIAGNOSIS — F39 Unspecified mood [affective] disorder: Secondary | ICD-10-CM

## 2022-01-07 DIAGNOSIS — M5441 Lumbago with sciatica, right side: Secondary | ICD-10-CM | POA: Diagnosis present

## 2022-01-07 DIAGNOSIS — M542 Cervicalgia: Secondary | ICD-10-CM

## 2022-01-07 MED ORDER — DULOXETINE HCL 60 MG PO CPEP
60.0000 mg | ORAL_CAPSULE | Freq: Every day | ORAL | 3 refills | Status: AC
Start: 2022-01-07 — End: ?

## 2022-01-07 NOTE — Progress Notes (Signed)
Subjective:    Patient ID: Darrell Patrick, male    DOB: 12/11/1970, 51 y.o.   MRN: [id number]  HPI HPI 8/3//23 [name] is a past medical history of hypertension, hypothyroidism status post radioactive iodine, rheumatoid arthritis who is here for his chronic lower back pain.  Patient reports he has had chronic low back pain for many years.  He suspects the pain is related to his prior work at [employer] where he had to do a lot of heavy lifting.  He reports he stopped working at this job May 2022.  He developed sciatica around 2008 with shooting pain down his right lower extremity down to his lateral foot.  He reports he felt a popping sensation when he was walking before this pain began. He is not able to recall what work-up was done for his sciatica or any treatments that were attempted for this issue.  He reports the sciatica has improved but he continues to have numbness in the distribution of this pain.  He continues to have sharp burning and aching pain in his lower back.  He also has less severe pain in his neck and thoracic spine.  Additionally, he has chronic pain in most of the joints throughout his body suspected to be related to his rheumatoid arthritis.  He is followed by rheumatology and is on Humira.  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   He reports medications for HA have not significantly reduced his overall pain.  Ibuprofen helps his pain slightly.  He has not tried any other pain medications previously.  He denies any history of kidney or liver disease.   Interval history Mr. Montour is here for follow-up regarding his chronic pain he continues to have sharp and aching chronic lower back pain with occasional shooting pain down his right lower leg.  He also has pain in his shoulders and knees.  He has not noted a significant change in his pain with duloxetine at this point.  He does not have any side effects with this medication.  He reports his mood is frequently  decreased due to stressors related to not being able to work.  Denies SI or HI. He continues to take meloxicam that helps reduce his pain.  He would like to avoid taking multiple frequent oral medications.  He has not completed his x-rays of his lower back and neck.   Pain Inventory Average Pain 7 Pain Right Now 7 My pain is constant and sharp  In the last 24 hours, has pain interfered with the following? General activity 8 Relation with others 2 Enjoyment of life 5 What TIME of day is your pain at its worst? daytime, evening, and night Sleep (in general) Fair  Pain is worse with: walking, bending, standing, and some activites Pain improves with: rest and medication Relief from Meds: 5  No family history on file. Social History   Socioeconomic History   Marital status: Married    Spouse name: Not on file   Number of children: Not on file   Years of education: Not on file   Highest education level: Not on file  Occupational History   Not on file  Tobacco Use   Smoking status: Former   Smokeless tobacco: Never  Vaping Use   Vaping Use: Never used  Substance and Sexual Activity   Alcohol use: Yes    Alcohol/week: 0.0 standard drinks of alcohol    Comment: Social   Drug  use: No   Sexual activity: Not on file  Other Topics Concern   Not on file  Social History Narrative   Not on file   Social Determinants of Health   Financial Resource Strain: Not on file  Food Insecurity: Not on file  Transportation Needs: Not on file  Physical Activity: Not on file  Stress: Not on file  Social Connections: Not on file   No past surgical history on file. No past surgical history on file. Past Medical History:  Diagnosis Date   Hypertension    Thyroid disease    BP (!) 137/91   Pulse 89   Ht 5\' 9"  (1.753 m)   Wt 229 lb 12.8 oz (104.2 kg)   SpO2 97%   BMI 33.94 kg/m   Opioid Risk Score:   Fall Risk Score:  `1  Depression screen [facility] 2/9     01/07/2022   10:55 AM  11/18/2021   10:51 AM 05/05/2017   10:23 AM 08/28/2015   11:07 AM 05/08/2015    2:54 PM 02/13/2015    8:43 AM  Depression screen PHQ 2/9  Decreased Interest 2 2 0 0 0 0  Down, Depressed, Hopeless 2 2 0 0 0 0  PHQ - 2 Score 4 4 0 0 0 0  Altered sleeping  3      Tired, decreased energy  3      Change in appetite  2      Feeling bad or failure about yourself   2      Trouble concentrating  1      Moving slowly or fidgety/restless  1      Suicidal thoughts  0      PHQ-9 Score  16      Difficult doing work/chores  Somewhat difficult         Review of Systems  Constitutional: Negative.   HENT: Negative.    Eyes: Negative.   Respiratory: Negative.    Cardiovascular: Negative.   Gastrointestinal: Negative.   Endocrine: Negative.   Genitourinary: Negative.   Musculoskeletal:  Positive for gait problem.       Knee pain  Skin: Negative.   Allergic/Immunologic: Negative.   Hematological: Negative.   Psychiatric/Behavioral:  Positive for dysphoric mood.   All other systems reviewed and are negative.      Objective:   Physical Exam  Gen: no distress, normal appearing HEENT: oral mucosa pink and moist, NCAT Cardio: Reg rate Chest: normal effort, normal rate of breathing Abd: soft, non-distended Ext: no edema Psych: pleasant, affect slightly flat Skin: intact Neuro: Alert and oriented x4, follows commands, cranial nerves II through XII grossly intact, sensation intact to light touch in the bilateral upper extremities and left lower extremity, he has decreased sensation over his posterior lateral leg and lateral foot Strength 5 out of 5 in all 4 extremities throughout musculoskeletal:  Lower spine paraspinal tenderness, he also has mild tenderness at cervical spine he continues to be tender at his bilateral shoulders, ankles, elbows, wrists, knees Facet loading positive L-spine SLR negative b/l Decreased lumbar and cervical motion in all directions to receive No significant Joint  deformity noted      Assessment & Plan:   Chronic lower back pain with R radicular pain in S1 distribution -Lumbar  x-rays were ordered at last visit, patient reports he will go to complete these x-rays -Increase duloxetine increased to 60 mg daily -Home exercise program with core strengthening, PT consult may be an  option in the future  Chronic cervical spine pain without radicular pain -Cervical spine x-rays were ordered last visit   Rheumatoid Arthritis with polyarthralgia -Continue to f/u with Rheumatology patient reports he has a visit scheduled next week  Mood disorder -Increasing duloxetine dose may provide benefit here as well

## 2022-01-12 ENCOUNTER — Telehealth: Payer: Self-pay | Admitting: *Deleted

## 2022-01-12 DIAGNOSIS — G8929 Other chronic pain: Secondary | ICD-10-CM

## 2022-01-12 DIAGNOSIS — M542 Cervicalgia: Secondary | ICD-10-CM

## 2022-01-12 NOTE — Telephone Encounter (Signed)
Darrell Patrick called requesting his back x rays be done at [facility].

## 2022-01-20 NOTE — Addendum Note (Signed)
Addended by: Jennye Boroughs on: 01/20/2022 03:48 PM   Modules accepted: Orders

## 2022-01-20 NOTE — Telephone Encounter (Signed)
Xray orders faxed to [facility].  Patient notified.

## 2022-02-02 ENCOUNTER — Telehealth: Payer: Self-pay

## 2022-02-02 NOTE — Telephone Encounter (Signed)
Patient called asking for results of xrays done at [facility]

## 2022-03-04 ENCOUNTER — Encounter: Payer: Self-pay | Admitting: Physical Medicine & Rehabilitation

## 2022-03-04 ENCOUNTER — Encounter
Payer: No Typology Code available for payment source | Attending: Physical Medicine & Rehabilitation | Admitting: Physical Medicine & Rehabilitation

## 2022-03-04 VITALS — BP 128/86 | HR 85 | Ht 69.0 in | Wt 218.0 lb

## 2022-03-04 DIAGNOSIS — M0609 Rheumatoid arthritis without rheumatoid factor, multiple sites: Secondary | ICD-10-CM | POA: Insufficient documentation

## 2022-03-04 DIAGNOSIS — M5441 Lumbago with sciatica, right side: Secondary | ICD-10-CM | POA: Insufficient documentation

## 2022-03-04 DIAGNOSIS — M542 Cervicalgia: Secondary | ICD-10-CM | POA: Insufficient documentation

## 2022-03-04 DIAGNOSIS — F39 Unspecified mood [affective] disorder: Secondary | ICD-10-CM | POA: Diagnosis not present

## 2022-03-04 DIAGNOSIS — G8929 Other chronic pain: Secondary | ICD-10-CM | POA: Insufficient documentation

## 2022-03-04 MED ORDER — GABAPENTIN 100 MG PO CAPS: 100.0000 mg | ORAL_CAPSULE | Freq: Three times a day (TID) | ORAL | 2 refills | Status: DC

## 2022-03-04 NOTE — Progress Notes (Unsigned)
Subjective:    Patient ID: Darrell Patrick, male    DOB: 09-04-1970, 51 y.o.   MRN: [id number]  HPI  HPI 8/3//23 Darrell Patrick is a past medical history of hypertension, hypothyroidism status post radioactive iodine, rheumatoid arthritis who is here for his chronic lower back pain.  Patient reports he has had chronic low back pain for many years.  He suspects the pain is related to his prior work at [employer] where he had to do a lot of heavy lifting.  He reports he stopped working at this job May 2022.  He developed sciatica around 2008 with shooting pain down his right lower extremity down to his lateral foot.  He reports he felt a popping sensation when he was walking before this pain began. He is not able to recall what work-up was done for his sciatica or any treatments that were attempted for this issue.  He reports the sciatica has improved but he continues to have numbness in the distribution of this pain.  He continues to have sharp burning and aching pain in his lower back.  He also has less severe pain in his neck and thoracic spine.  Additionally, he has chronic pain in most of the joints throughout his body suspected to be related to his rheumatoid arthritis.  He is followed by rheumatology and is on Humira.  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   He reports medications for HA have not significantly reduced his overall pain.  Ibuprofen helps his pain slightly.  He has not tried any other pain medications previously.  He denies any history of kidney or liver disease.   Interval history 01/07/22 Darrell Patrick is here for follow-up regarding his chronic pain he continues to have sharp and aching chronic lower back pain with occasional shooting pain down his right lower leg.  He also has pain in his shoulders and knees.  He has not noted a significant change in his pain with duloxetine at this point.  He does not have any side effects with this medication.  He reports his mood is  frequently decreased due to stressors related to not being able to work.  Denies SI or HI. He continues to take meloxicam that helps reduce his pain.  He would like to avoid taking multiple frequent oral medications.  He has not completed his x-rays of his lower back and neck.  Interval History Darrell Patrick is here for follow-up regarding his chronic lower back and neck pain.  He also has pain in many other joints such as his shoulders and knees.  He is followed with rheumatology for rheumatoid arthritis.  He has chronic numbness and pain along the lateral right leg going down to his lateral right foot.  He reports his weight is down now that he is off prednisone for his rheumatoid arthritis.  Pain is doing better with duloxetine.  He also recently has noticed having more energy.  He is not using meloxicam but has been using ibuprofen with benefit to his pain.  He recently had x-rays of his lumbar and cervical spine completed.  Pain Inventory Average Pain 7 Pain Right Now 7 My pain is sharp and aching  In the last 24 hours, has pain interfered with the following? General activity 3 Relation with others 2 Enjoyment of life 3 What TIME of day is your pain at its worst? morning , daytime, evening, and night Sleep (in general) Fair  Pain is  worse with: walking, bending, and standing Pain improves with: rest and medication Relief from Meds: 4  No family history on file. Social History   Socioeconomic History   Marital status: Married    Spouse name: Not on file   Number of children: Not on file   Years of education: Not on file   Highest education level: Not on file  Occupational History   Not on file  Tobacco Use   Smoking status: Former   Smokeless tobacco: Never  Vaping Use   Vaping Use: Never used  Substance and Sexual Activity   Alcohol use: Yes    Alcohol/week: 0.0 standard drinks of alcohol    Comment: Social   Drug use: No   Sexual activity: Not on file  Other Topics  Concern   Not on file  Social History Narrative   Not on file   Social Determinants of Health   Financial Resource Strain: Not on file  Food Insecurity: Not on file  Transportation Needs: Not on file  Physical Activity: Not on file  Stress: Not on file  Social Connections: Not on file   No past surgical history on file. No past surgical history on file. Past Medical History:  Diagnosis Date   Hypertension    Thyroid disease    There were no vitals taken for this visit.  Opioid Risk Score:   Fall Risk Score:  `1  Depression screen [facility] 2/9     01/07/2022   10:55 AM 11/18/2021   10:51 AM 05/05/2017   10:23 AM 08/28/2015   11:07 AM 05/08/2015    2:54 PM 02/13/2015    8:43 AM  Depression screen PHQ 2/9  Decreased Interest 2 2 0 0 0 0  Down, Depressed, Hopeless 2 2 0 0 0 0  PHQ - 2 Score 4 4 0 0 0 0  Altered sleeping  3      Tired, decreased energy  3      Change in appetite  2      Feeling bad or failure about yourself   2      Trouble concentrating  1      Moving slowly or fidgety/restless  1      Suicidal thoughts  0      PHQ-9 Score  16      Difficult doing work/chores  Somewhat difficult          Review of Systems  Musculoskeletal:  Positive for back pain, gait problem and neck pain.       B/L shoulder, hand, neck pain   All other systems reviewed and are negative.     Objective:   Physical Exam  Gen: no distress, normal appearing HEENT: oral mucosa pink and moist, NCAT Chest: normal effort, normal rate of breathing Abd: soft, non-distended Ext: no edema Psych: pleasant, affect slightly flat Skin: intact Neuro: Awake and alert, follows commands, cranial nerves II through XII grossly intact, sensation intact to light touch in the bilateral upper extremities and left lower extremity, he has decreased sensation over his posterior lateral leg and lateral foot Strength 5 out of 5 in all 4 extremities throughout musculoskeletal:  Lower spine paraspinal  tenderness, he also has mild tenderness at cervical spine he continues to be tender at his bilateral shoulders, ankles, elbows, wrists, knees Facet loading mildly positive L-spine Slump positive on Right No significant Joint deformity noted      Assessment & Plan:    Chronic lower back pain with  R radicular pain in S1 distribution -Lumbar x-rays report indicated degenerative changes however was not very specific regarding levels and locations of these changes -Continue duloxetine 60 mg daily -Home exercise program with core strengthening, PT consult may be an option in the future -[facility] last week, asked to send images and report to [facility], I am unable to locate this pain syndrome, will call again to request images and report -We will add gabapentin 100 mg 3 times a day   Chronic cervical spine pain without radicular pain -Cervical spine x-ray indicated degenerative changes however was not very specific regarding levels and locations of these changes with degenerative changes -Called [facility] to have images and report sent to [facility] health, unable to find results, will call again to get images and report   Rheumatoid Arthritis with polyarthralgia -Continue to f/u with Rheumatology patient reports he has a visit scheduled next week -Continue Ibuprofen PRN, do not use multiple NSAIDs at the same time   Mood disorder -Duloxetine appears to be helping with mood as well -provided a little benefit

## 2022-03-25 ENCOUNTER — Telehealth (HOSPITAL_COMMUNITY): Payer: Self-pay | Admitting: Physical Medicine & Rehabilitation

## 2022-04-26 ENCOUNTER — Encounter
Payer: No Typology Code available for payment source | Attending: Physical Medicine & Rehabilitation | Admitting: Physical Medicine & Rehabilitation

## 2022-04-26 DIAGNOSIS — M5441 Lumbago with sciatica, right side: Secondary | ICD-10-CM | POA: Insufficient documentation

## 2022-04-26 DIAGNOSIS — G8929 Other chronic pain: Secondary | ICD-10-CM | POA: Insufficient documentation

## 2022-04-26 DIAGNOSIS — M0609 Rheumatoid arthritis without rheumatoid factor, multiple sites: Secondary | ICD-10-CM | POA: Insufficient documentation

## 2022-04-26 DIAGNOSIS — F39 Unspecified mood [affective] disorder: Secondary | ICD-10-CM | POA: Insufficient documentation

## 2022-04-26 DIAGNOSIS — M542 Cervicalgia: Secondary | ICD-10-CM | POA: Insufficient documentation

## 2022-05-24 ENCOUNTER — Telehealth: Payer: Self-pay | Admitting: Nurse Practitioner

## 2022-05-24 DIAGNOSIS — E890 Postprocedural hypothyroidism: Secondary | ICD-10-CM

## 2022-05-24 DIAGNOSIS — E559 Vitamin D deficiency, unspecified: Secondary | ICD-10-CM

## 2022-05-24 NOTE — Telephone Encounter (Signed)
Labs updated and sent to [facility]. 

## 2022-05-24 NOTE — Telephone Encounter (Signed)
Pt needs labs updated  

## 2022-06-01 NOTE — Patient Instructions (Incomplete)

## 2022-06-02 ENCOUNTER — Ambulatory Visit: Payer: No Typology Code available for payment source | Admitting: Nurse Practitioner

## 2022-06-02 DIAGNOSIS — E890 Postprocedural hypothyroidism: Secondary | ICD-10-CM

## 2022-06-02 DIAGNOSIS — E559 Vitamin D deficiency, unspecified: Secondary | ICD-10-CM

## 2022-06-20 ENCOUNTER — Other Ambulatory Visit: Payer: Self-pay | Admitting: "Endocrinology

## 2022-06-20 DIAGNOSIS — E890 Postprocedural hypothyroidism: Secondary | ICD-10-CM

## 2022-07-06 ENCOUNTER — Ambulatory Visit: Payer: No Typology Code available for payment source | Admitting: Nurse Practitioner

## 2022-07-06 DIAGNOSIS — E890 Postprocedural hypothyroidism: Secondary | ICD-10-CM

## 2022-07-06 LAB — TSH: TSH: 3.41 u[IU]/mL (ref 0.450–4.500)

## 2022-07-06 LAB — VITAMIN D 25 HYDROXY (VIT D DEFICIENCY, FRACTURES): Vit D, 25-Hydroxy: 7.9 ng/mL — ABNORMAL LOW (ref 30.0–100.0)

## 2022-07-06 LAB — T4, FREE: Free T4: 1.69 ng/dL (ref 0.82–1.77)

## 2022-07-11 ENCOUNTER — Encounter: Payer: Self-pay | Admitting: Nurse Practitioner

## 2022-07-11 ENCOUNTER — Ambulatory Visit (INDEPENDENT_AMBULATORY_CARE_PROVIDER_SITE_OTHER): Payer: No Typology Code available for payment source | Admitting: Nurse Practitioner

## 2022-07-11 VITALS — BP 120/81 | HR 86 | Ht 69.0 in | Wt 216.4 lb

## 2022-07-11 DIAGNOSIS — E559 Vitamin D deficiency, unspecified: Secondary | ICD-10-CM

## 2022-07-11 DIAGNOSIS — E890 Postprocedural hypothyroidism: Secondary | ICD-10-CM | POA: Diagnosis not present

## 2022-07-11 MED ORDER — VITAMIN D (ERGOCALCIFEROL) 1.25 MG (50000 UNIT) PO CAPS: 50000.0000 [IU] | ORAL_CAPSULE | ORAL | 3 refills | Status: DC

## 2022-07-11 MED ORDER — LEVOTHYROXINE SODIUM 175 MCG PO TABS: 175.0000 ug | ORAL_TABLET | Freq: Every day | ORAL | 3 refills | Status: DC

## 2022-07-11 NOTE — Patient Instructions (Signed)

## 2022-07-11 NOTE — Progress Notes (Signed)
07/11/2022  Endocrinology follow-up note    Subjective:    Patient ID: Darrell Patrick, male    DOB: Aug 25, 1970,    Past Medical History:  Diagnosis Date   Hypertension    Thyroid disease    History reviewed. No pertinent surgical history. Social History   Socioeconomic History   Marital status: Married    Spouse name: Not on file   Number of children: Not on file   Years of education: Not on file   Highest education level: Not on file  Occupational History   Not on file  Tobacco Use   Smoking status: Former   Smokeless tobacco: Never  Vaping Use   Vaping Use: Never used  Substance and Sexual Activity   Alcohol use: Yes    Alcohol/week: 0.0 standard drinks of alcohol    Comment: Social   Drug use: No   Sexual activity: Not on file  Other Topics Concern   Not on file  Social History Narrative   Not on file   Social Determinants of Health   Financial Resource Strain: Not on file  Food Insecurity: Not on file  Transportation Needs: Not on file  Physical Activity: Not on file  Stress: Not on file  Social Connections: Not on file   Outpatient Encounter Medications as of 07/11/2022  Medication Sig   ALPRAZolam (XANAX) 1 MG tablet Take 1 mg by mouth once.   amLODipine (NORVASC) 10 MG tablet Take 10 mg by mouth daily.   atropine 1 % ophthalmic solution    cetirizine (ZYRTEC) 10 MG tablet Take 10 mg by mouth daily.   cyclobenzaprine (FLEXERIL) 5 MG tablet Take 1 tablet (5 mg total) by mouth 3 (three) times daily as needed for muscle spasms.   diclofenac (VOLTAREN) 75 MG EC tablet    diclofenac Sodium (VOLTAREN) 1 % GEL Apply 4 g topically 4 (four) times daily.   dorzolamide-timolol (COSOPT) 22.3-6.8 MG/ML ophthalmic solution 1 drop 2 (two) times daily.   DULoxetine (CYMBALTA) 60 MG capsule Take 1 capsule (60 mg total) by mouth daily.   esomeprazole (NEXIUM) 20 MG capsule Take 20 mg by mouth daily.   folic acid (FOLVITE) 1 MG tablet Take 1  mg by mouth daily.   gabapentin (NEURONTIN) 100 MG capsule Take 1 capsule (100 mg total) by mouth 3 (three) times daily.   HUMIRA PEN 40 MG/0.4ML PNKT SMARTSIG:40 Milligram(s) SUB-Q Every 2 Weeks   hydrochlorothiazide (HYDRODIURIL) 50 MG tablet Take 50 mg by mouth daily.   LORazepam (ATIVAN) 1 MG tablet Take 1 mg by mouth every 6 (six) hours as needed for anxiety.   meloxicam (MOBIC) 15 MG tablet    methotrexate (RHEUMATREX) 2.5 MG tablet Take 15 mg by mouth once a week.   Vitamin D, Ergocalciferol, (DRISDOL) 1.25 MG (50000 UNIT) CAPS capsule Take 1 capsule (50,000 Units total) by mouth every 7 (seven) days.   [DISCONTINUED] levothyroxine (SYNTHROID) 175 MCG tablet Take 1 tablet by mouth once daily   levothyroxine (SYNTHROID) 175 MCG tablet Take 1 tablet (175 mcg total) by mouth daily.   prednisoLONE acetate (PRED FORTE) 1 % ophthalmic suspension  (Patient not taking: Reported on 03/04/2022)   sildenafil (VIAGRA) 100 MG tablet Take by mouth.   No facility-administered encounter medications on file as of 07/11/2022.   ALLERGIES: No Known Allergies VACCINATION STATUS:  There is  no immunization history on file for this patient.  HPI Thyroid Problem Presents for follow-up visit. Symptoms include fatigue. Patient reports no anxiety, cold intolerance, constipation, depressed mood, diarrhea, heat intolerance, leg swelling, palpitations, tremors, weight gain or weight loss. The symptoms have been stable.    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  He is on Levothyroxine 175 g by mouth every morning. He reports compliance.    The patient denies family history of thyroid dysfunction.  Patient denies personal history of goiter.  He also has hypertension.  Review of systems  Constitutional: + Minimally fluctuating body weight,  current Body mass index is 31.96 kg/m. , + fatigue, no subjective  hyperthermia, no subjective hypothermia Eyes: + blurry vision- sees [facility] for Uveitis, + xerophthalmia ENT: no sore throat, no nodules palpated in throat, no dysphagia/odynophagia, no hoarseness Cardiovascular: no chest pain, no shortness of breath, no palpitations, no leg swelling Respiratory: no cough, no shortness of breath Gastrointestinal: no nausea/vomiting/diarrhea Musculoskeletal:  diffuse muscle/joint aches- hx RA Skin: no rashes, no hyperemia Neurological: no tremors, no numbness, no tingling, no dizziness Psychiatric: no depression, no anxiety   Objective:    BP 120/81 (BP Location: Left Arm, Patient Position: Sitting, Cuff Size: Large)   Pulse 86   Ht 5\' 9"  (1.753 m)   Wt 216 lb 6.4 oz (98.2 kg)   BMI 31.96 kg/m   Wt Readings from Last 3 Encounters:  07/11/22 216 lb 6.4 oz (98.2 kg)  03/04/22 218 lb (98.9 kg)  01/07/22 229 lb 12.8 oz (104.2 kg)    BP Readings from Last 3 Encounters:  07/11/22 120/81  03/04/22 128/86  01/07/22 (!) 137/91     Physical Exam- Limited  Constitutional:  Body mass index is 31.96 kg/m. , not in acute distress, normal state of mind Eyes:  EOMI, no exophthalmos Musculoskeletal: no gross deformities, strength intact in all four extremities, no gross restriction of joint movements Skin:  no rashes, no hyperemia Neurological: no tremor with outstretched hands    Recent Results (from the past 2160 hour(s))  Vitamin D, 25-hydroxy     Status: Abnormal   Collection Time: 07/05/22 12:36 PM  Result Value Ref Range   Vit D, 25-Hydroxy 7.9 (L) 30.0 - 100.0 ng/mL    Comment: Vitamin D deficiency has been defined by the Institute of Medicine and an Endocrine Society practice guideline as a level of serum 25-OH vitamin D less than 20 ng/mL (1,2). The Endocrine Society went on to further define vitamin D insufficiency as a level between 21 and 29 ng/mL (2). 1. IOM (Institute of Medicine). 2010. Dietary reference    intakes for  calcium and D. [place]: The    [employer]. 2. Holick MF, Binkley [place], Bischoff-Ferrari HA, et al.    Evaluation, treatment, and prevention of vitamin D    deficiency: an Endocrine Society clinical practice    guideline. JCEM. 2011 Jul; 96(7):1911-30.   TSH     Status: None   Collection Time: 07/05/22 12:36 PM  Result Value Ref Range   TSH 3.410 0.450 - 4.500 uIU/mL  T4, Free     Status: None   Collection Time: 07/05/22 12:36 PM  Result Value Ref Range   Free T4 1.69 0.82 - 1.77 ng/dL     Latest Reference Range & Units 11/05/20 14:37 01/29/21 11:13 05/31/21 16:42 07/05/22 12:36  TSH 0.450 - 4.500  uIU/mL 1.340 0.628 0.437 (L) 3.410  T4,Free(Direct) 0.82 - 1.77 ng/dL 1.65 1.76 1.60 1.69  (L): Data is abnormally low  Assessment & Plan:   1.  RAI hypothyroidism  - He received radioactive iodine thyroid ablation for Graves' disease in November 2017.     -His previsit thyroid function tests are consistent with appropriate hormone replacement.  He is advised to continue Levothyroxine 175 mcg po daily before breakfast.    - We discussed about the correct intake of his thyroid hormone, on empty stomach at fasting, with water, separated by at least 30 minutes from breakfast and other medications,  and separated by more than 4 hours from calcium, iron, multivitamins, acid reflux medications (PPIs). -Patient is made aware of the fact that thyroid hormone replacement is needed for life, dose to be adjusted by periodic monitoring of thyroid function tests.  2. Vitamin D deficiency His most recent Vitamin D level from 07/05/22 was 7.9.  He is not currently on any supplementation.  Will initiate Ergocalciferol 50000 units PO weekly.    I spent  16  minutes in the care of the patient today including review of labs from Thyroid Function, CMP, and other relevant labs ; imaging/biopsy records (current and previous including abstractions from other facilities); face-to-face time  discussing  his lab results and symptoms, medications doses, his options of short and long term treatment based on the latest standards of care / guidelines;   and documenting the encounter.  Darrell Patrick  participated in the discussions, expressed understanding, and voiced agreement with the above plans.  All questions were answered to his satisfaction. he is encouraged to contact clinic should he have any questions or concerns prior to his return visit.   Follow up plan: Return in about 1 year (around 07/11/2023) for Thyroid follow up, Previsit labs.    Rayetta Pigg, [facility] [address] [place], [place] [address] Phone: [phone] Fax: [phone]  07/11/2022, 4:23 PM

## 2022-08-22 ENCOUNTER — Telehealth: Payer: Self-pay | Admitting: Nurse Practitioner

## 2022-08-22 NOTE — Telephone Encounter (Signed)
Received Medical Records Request from [place] [employer] [place] [employer]. Sent to [facility] Medical Records for Release

## 2022-09-08 ENCOUNTER — Encounter
Payer: No Typology Code available for payment source | Attending: Physical Medicine & Rehabilitation | Admitting: Physical Medicine & Rehabilitation

## 2022-09-08 ENCOUNTER — Encounter: Payer: Self-pay | Admitting: Physical Medicine & Rehabilitation

## 2022-09-08 VITALS — BP 115/85 | HR 79 | Ht 69.0 in | Wt 216.0 lb

## 2022-09-08 DIAGNOSIS — M0609 Rheumatoid arthritis without rheumatoid factor, multiple sites: Secondary | ICD-10-CM | POA: Insufficient documentation

## 2022-09-08 DIAGNOSIS — M542 Cervicalgia: Secondary | ICD-10-CM | POA: Diagnosis present

## 2022-09-08 DIAGNOSIS — G8929 Other chronic pain: Secondary | ICD-10-CM | POA: Diagnosis present

## 2022-09-08 DIAGNOSIS — M5441 Lumbago with sciatica, right side: Secondary | ICD-10-CM | POA: Diagnosis present

## 2022-09-08 MED ORDER — GABAPENTIN 300 MG PO CAPS: 300.0000 mg | ORAL_CAPSULE | Freq: Three times a day (TID) | ORAL | 4 refills | Status: AC

## 2022-09-08 NOTE — Progress Notes (Signed)
Subjective:    Patient ID: Darrell Patrick, male    DOB: 03/14/1971, 52 y.o.   MRN: [id number]  HPI   HPI 8/3//23 Darrell Patrick is a past medical history of hypertension, hypothyroidism status post radioactive iodine, rheumatoid arthritis who is here for his chronic lower back pain.  Patient reports he has had chronic low back pain for many years.  He suspects the pain is related to his prior work at [employer] where he had to do a lot of heavy lifting.  He reports he stopped working at this job May 2022.  He developed sciatica around 2008 with shooting pain down his right lower extremity down to his lateral foot.  He reports he felt a popping sensation when he was walking before this pain began. He is not able to recall what work-up was done for his sciatica or any treatments that were attempted for this issue.  He reports the sciatica has improved but he continues to have numbness in the distribution of this pain.  He continues to have sharp burning and aching pain in his lower back.  He also has less severe pain in his neck and thoracic spine.  Additionally, he has chronic pain in most of the joints throughout his body suspected to be related to his rheumatoid arthritis.  He is followed by rheumatology and is on Humira.  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   He reports medications for HA have not significantly reduced his overall pain.  Ibuprofen helps his pain slightly.  He has not tried any other pain medications previously.  He denies any history of kidney or liver disease.   Interval history 01/07/22 Darrell Patrick is here for follow-up regarding his chronic pain he continues to have sharp and aching chronic lower back pain with occasional shooting pain down his right lower leg.  He also has pain in his shoulders and knees.  He has not noted a significant change in his pain with duloxetine at this point.  He does not have any side effects with this medication.  He reports his mood is  frequently decreased due to stressors related to not being able to work.  Denies SI or HI. He continues to take meloxicam that helps reduce his pain.  He would like to avoid taking multiple frequent oral medications.  He has not completed his x-rays of his lower back and neck.   Interval History 03/04/22 Darrell Patrick is here for follow-up regarding his chronic lower back and neck pain.  He also has pain in many other joints such as his shoulders and knees.  He is followed with rheumatology for rheumatoid arthritis.  He has chronic numbness and pain along the lateral right leg going down to his lateral right foot.  He reports his weight is down now that he is off prednisone for his rheumatoid arthritis.  Pain is doing better with duloxetine.  He also recently has noticed having more energy.  He is not using meloxicam but has been using ibuprofen with benefit to his pain.  He recently had x-rays of his lumbar and cervical spine completed.   Interval History 09/08/22 Darrell Patrick is here for follow-up regarding his chronic pain.  He he continues to have severe pain in his lower back and neck.  Lower back pain continues to shoot down his right lower extremity and he has numbness on his right lateral foot.  This been going on for several years but  has worsened a little bit recently.  He continues to follow-up with rheumatology for his rheumatoid arthritis.  He also has been treated for uveitis and reports eyedrops are making his vision more blurry.  He plans to call the eye doctor to follow-up about this.  He also has pain in his shoulders, knees, hands and feet.  Currently his shoulders are the most sore.  He is taking duloxetine and gabapentin with some benefit.  His activities continue to be limited by the pain.  Duloxetine is also helping his mood.   Pain Inventory Average Pain 7 Pain Right Now 7 My pain is sharp, stabbing, and aching  In the last 24 hours, has pain interfered with the following? General  activity 1 Relation with others 4 Enjoyment of life 3 What TIME of day is your pain at its worst? morning , daytime, and evening Sleep (in general) Poor  Pain is worse with: walking, bending, sitting, standing, and some activites Pain improves with: rest, medication, and injections Relief from Meds: 5  No family history on file. Social History   Socioeconomic History   Marital status: Married    Spouse name: Not on file   Number of children: Not on file   Years of education: Not on file   Highest education level: Not on file  Occupational History   Not on file  Tobacco Use   Smoking status: Former   Smokeless tobacco: Never  Vaping Use   Vaping Use: Never used  Substance and Sexual Activity   Alcohol use: Yes    Alcohol/week: 0.0 standard drinks of alcohol    Comment: Social   Drug use: No   Sexual activity: Not on file  Other Topics Concern   Not on file  Social History Narrative   Not on file   Social Determinants of Health   Financial Resource Strain: Not on file  Food Insecurity: Not on file  Transportation Needs: Not on file  Physical Activity: Not on file  Stress: Not on file  Social Connections: Not on file   No past surgical history on file. No past surgical history on file. Past Medical History:  Diagnosis Date   Hypertension    Thyroid disease    BP 115/85   Pulse 79   Ht 5\' 9"  (1.753 m)   Wt 216 lb (98 kg)   SpO2 94%   BMI 31.90 kg/m   Opioid Risk Score:   Fall Risk Score:  `1  Depression screen [facility] 2/9     09/08/2022   10:44 AM 03/04/2022   10:16 AM 01/07/2022   10:55 AM 11/18/2021   10:51 AM 05/05/2017   10:23 AM 08/28/2015   11:07 AM 05/08/2015    2:54 PM  Depression screen PHQ 2/9  Decreased Interest 3 0 2 2 0 0 0  Down, Depressed, Hopeless 3 0 2 2 0 0 0  PHQ - 2 Score 6 0 4 4 0 0 0  Altered sleeping 1   3     Tired, decreased energy 3   3     Change in appetite 1   2     Feeling bad or failure about yourself  3   2      Trouble concentrating 3   1     Moving slowly or fidgety/restless 1   1     Suicidal thoughts 2   0     PHQ-9 Score 20   16     Difficult  doing work/chores Extremely dIfficult   Somewhat difficult         Review of Systems  Musculoskeletal:  Positive for back pain.       Bilateral shoulder pain Bilateral knee pain Bilateral feet pain Bilateral hand pain  All other systems reviewed and are negative.     Objective:   Physical Exam   Gen: no distress, normal appearing HEENT: oral mucosa pink and moist, NCAT Chest: normal effort, normal rate of breathing Abd: soft, non-distended Ext: no edema Psych: pleasant, affect slightly flat Skin: intact Neuro: Awake and alert, follows commands, cranial nerves II through XII grossly intact, sensation intact to light touch in the bilateral upper extremities and left lower extremity, he has decreased sensation over his posterior lateral leg and lateral foot Strength no focal deficits noted however appears to have some pain limitations musculoskeletal:  Lower spine paraspinal tenderness and cervical spine tenderness.  He also has tenderness around his bilateral shoulders and trapezius.  He is sore on his bilateral knees, hands and feet. Facet loading mildly positive L-spine Slump positive on Right      Assessment & Plan:    Chronic lower back pain with R radicular pain in S1 distribution -Lumbar x-rays report indicated degenerative changes, gradually worsening.  Will order MRI for further evaluation, may provide additional options for intervention as well -Continue duloxetine 60 mg daily -Home exercise program with core strengthening, will order PT consult, will do this in his local area per patient request -Increase gabapentin to 300 mg 3 times daily from 100 mg 3 times daily -Order zynex nexwave - done! -Patient reports having occasional muscle cramps, discussed trying magnesium supplementation -Discussed foods that are good for pain,  advised trying turmeric -Consider tramadol at later visit   Chronic cervical spine pain without radicular pain -Cervical spine x-ray indicated degenerative changes, have not seen images if needed will try calling again to [facility] imaging center to get images sent over  Rheumatoid Arthritis with polyarthralgia -Continue to f/u with Rheumatology -Continue Ibuprofen PRN, do not use multiple NSAIDs at the same time   Mood disorder -Continue duloxetine 60 mg daily -Denies SI or HI

## 2022-11-08 ENCOUNTER — Encounter: Payer: No Typology Code available for payment source | Admitting: Physical Medicine & Rehabilitation

## 2022-12-13 ENCOUNTER — Encounter: Payer: No Typology Code available for payment source | Admitting: Physical Medicine & Rehabilitation

## 2023-01-17 ENCOUNTER — Encounter
Payer: No Typology Code available for payment source | Attending: Physical Medicine & Rehabilitation | Admitting: Physical Medicine & Rehabilitation

## 2023-01-17 ENCOUNTER — Encounter: Payer: Self-pay | Admitting: Physical Medicine & Rehabilitation

## 2023-01-17 VITALS — BP 123/76 | HR 88 | Ht 69.0 in | Wt 219.8 lb

## 2023-01-17 DIAGNOSIS — F39 Unspecified mood [affective] disorder: Secondary | ICD-10-CM

## 2023-01-17 DIAGNOSIS — M0609 Rheumatoid arthritis without rheumatoid factor, multiple sites: Secondary | ICD-10-CM | POA: Diagnosis present

## 2023-01-17 DIAGNOSIS — G8929 Other chronic pain: Secondary | ICD-10-CM | POA: Diagnosis present

## 2023-01-17 DIAGNOSIS — M5441 Lumbago with sciatica, right side: Secondary | ICD-10-CM | POA: Diagnosis not present

## 2023-01-17 DIAGNOSIS — M542 Cervicalgia: Secondary | ICD-10-CM

## 2023-01-17 NOTE — Progress Notes (Signed)
Subjective:    Patient ID: Darrell Patrick, male    DOB: 05/08/1970, 52 y.o.   MRN: [id number]  HPI HPI 8/3//23 Darrell Patrick is a past medical history of hypertension, hypothyroidism status post radioactive iodine, rheumatoid arthritis who is here for his chronic lower back pain.  Patient reports he has had chronic low back pain for many years.  He suspects the pain is related to his prior work at [employer] where he had to do a lot of heavy lifting.  He reports he stopped working at this job May 2022.  He developed sciatica around 2008 with shooting pain down his right lower extremity down to his lateral foot.  He reports he felt a popping sensation when he was walking before this pain began. He is not able to recall what work-up was done for his sciatica or any treatments that were attempted for this issue.  He reports the sciatica has improved but he continues to have numbness in the distribution of this pain.  He continues to have sharp burning and aching pain in his lower back.  He also has less severe pain in his neck and thoracic spine.  Additionally, he has chronic pain in most of the joints throughout his body suspected to be related to his rheumatoid arthritis.  He is followed by rheumatology and is on Humira.  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   He reports medications for HA have not significantly reduced his overall pain.  Ibuprofen helps his pain slightly.  He has not tried any other pain medications previously.  He denies any history of kidney or liver disease.   Interval history 01/07/22 Darrell Patrick is here for follow-up regarding his chronic pain he continues to have sharp and aching chronic lower back pain with occasional shooting pain down his right lower leg.  He also has pain in his shoulders and knees.  He has not noted a significant change in his pain with duloxetine at this point.  He does not have any side effects with this medication.  He reports his mood is  frequently decreased due to stressors related to not being able to work.  Denies SI or HI. He continues to take meloxicam that helps reduce his pain.  He would like to avoid taking multiple frequent oral medications.  He has not completed his x-rays of his lower back and neck.   Interval History 03/04/22 Darrell Patrick is here for follow-up regarding his chronic lower back and neck pain.  He also has pain in many other joints such as his shoulders and knees.  He is followed with rheumatology for rheumatoid arthritis.  He has chronic numbness and pain along the lateral right leg going down to his lateral right foot.  He reports his weight is down now that he is off prednisone for his rheumatoid arthritis.  Pain is doing better with duloxetine.  He also recently has noticed having more energy.  He is not using meloxicam but has been using ibuprofen with benefit to his pain.  He recently had x-rays of his lumbar and cervical spine completed.     Interval History 09/08/22 Darrell Patrick is here for follow-up regarding his chronic pain.  He he continues to have severe pain in his lower back and neck.  Lower back pain continues to shoot down his right lower extremity and he has numbness on his right lateral foot.  This been going on for several years but  has worsened a little bit recently.  He continues to follow-up with rheumatology for his rheumatoid arthritis.  He also has been treated for uveitis and reports eyedrops are making his vision more blurry.  He plans to call the eye doctor to follow-up about this.  He also has pain in his shoulders, knees, hands and feet.  Currently his shoulders are the most sore.  He is taking duloxetine and gabapentin with some benefit.  His activities continue to be limited by the pain.  Duloxetine is also helping his mood.    Interval History 01/17/2023 Darrell Patrick is here for follow-up regarding his chronic pain primarily in his lower back and neck.  He continues to have shooting pain  and numbness in his right lateral foot.  He continues to follow with rheumatology and is treated for rheumatoid arthritis.  He has continued pain in his hands, shoulders, elbows.  Knee pain is doing better.  Pain is constant and every day, limiting his activities.  He has found TENS unit to be beneficial and this is helping.   Pain Inventory Average Pain 6 Pain Right Now 6 My pain is constant and aching  In the last 24 hours, has pain interfered with the following? General activity 2 Relation with others 3 Enjoyment of life 3 What TIME of day is your pain at its worst? morning  and night Sleep (in general) Poor  Pain is worse with: walking, bending, and standing Pain improves with: rest, heat/ice, and medication Relief from Meds: 4  No family history on file. Social History   Socioeconomic History   Marital status: Married    Spouse name: Not on file   Number of children: Not on file   Years of education: Not on file   Highest education level: Not on file  Occupational History   Not on file  Tobacco Use   Smoking status: Former   Smokeless tobacco: Never  Vaping Use   Vaping status: Never Used  Substance and Sexual Activity   Alcohol use: Yes    Alcohol/week: 0.0 standard drinks of alcohol    Comment: Social   Drug use: No   Sexual activity: Not on file  Other Topics Concern   Not on file  Social History Narrative   Not on file   Social Determinants of Health   Financial Resource Strain: Not on file  Food Insecurity: Not on file  Transportation Needs: Not on file  Physical Activity: Not on file  Stress: Not on file  Social Connections: Not on file   No past surgical history on file. No past surgical history on file. Past Medical History:  Diagnosis Date   Hypertension    Thyroid disease    BP 123/76   Pulse 88   Ht 5\' 9"  (1.753 m)   Wt 219 lb 12.8 oz (99.7 kg)   SpO2 93%   BMI 32.46 kg/m   Opioid Risk Score:   Fall Risk Score:  `1  Depression  screen [facility] 2/9     01/17/2023   11:50 AM 09/08/2022   10:44 AM 03/04/2022   10:16 AM 01/07/2022   10:55 AM 11/18/2021   10:51 AM 05/05/2017   10:23 AM 08/28/2015   11:07 AM  Depression screen PHQ 2/9  Decreased Interest 0 3 0 2 2 0 0  Down, Depressed, Hopeless 0 3 0 2 2 0 0  PHQ - 2 Score 0 6 0 4 4 0 0  Altered sleeping  1  3    Tired, decreased energy  3   3    Change in appetite  1   2    Feeling bad or failure about yourself   3   2    Trouble concentrating  3   1    Moving slowly or fidgety/restless  1   1    Suicidal thoughts  2   0    PHQ-9 Score  20   16    Difficult doing work/chores  Extremely dIfficult   Somewhat difficult       Review of Systems  Constitutional: Negative.   HENT: Negative.    Eyes: Negative.   Respiratory: Negative.    Cardiovascular: Negative.   Gastrointestinal: Negative.   Endocrine: Negative.   Genitourinary: Negative.   Musculoskeletal:  Positive for arthralgias and gait problem.  Skin: Negative.   Allergic/Immunologic: Negative.   Hematological: Negative.   Psychiatric/Behavioral: Negative.    All other systems reviewed and are negative.      Objective:   Physical Exam  Gen: no distress, normal appearing HEENT: oral mucosa pink and moist, NCAT Chest: normal effort, normal rate of breathing Abd: soft, non-distended Ext: no edema Psych: pleasant, affect slightly flat Skin: intact Nodule noted at his dorsal right wrist Neuro: Awake and alert, follows commands, cranial nerves II through XII grossly intact,  Decreased sensation over his posterior lateral leg and lateral foot-unchanged Strength intact to gravity and resistance in all 4 extremities, MMT limited by pain Musculoskeletal:  Lower spine paraspinal tenderness and cervical spine tenderness-unchanged  Bilateral shoulders and trapezius tenderness present.  Tenderness bilateral shoulders, hands, elbows  Minimal tenderness at bilateral knees today facet loading positive  L-spine Slump positive on Right       Assessment & Plan:   Chronic lower back pain with right S1 radiculopathy -Lumbar MRI with multilevel mild facet joint disease, degenerative disc disease L4-5, L5-S1 with suspected impingement right S1 -Continue duloxetine 60 mg daily -Order PT consult, will do this in his local area per patient request -Continue gabapentin to 300 mg 3 times daily from 100 mg 3 times daily -Order zynex nexwave - Continue use of this device -Muscle cramps- recommend magnesium supplementation -Discussed foods that are good for pain -Consider tramadol at later visit- he would like avoid additional medications today, will consider at next visit   Chronic cervical spine pain without radicular pain -Cervical spine x-ray indicated degenerative changes -As above    Rheumatoid Arthritis with polyarthralgia -Continue to f/u with Rheumatology -Continue Ibuprofen PRN, do not use multiple NSAIDs at the same time   Mood disorder -Continue duloxetine 60 mg daily -Denies SI or HI   Prior Marijuana use -Recommend continued cessation

## 2023-02-21 ENCOUNTER — Encounter: Payer: No Typology Code available for payment source | Admitting: Physical Medicine & Rehabilitation

## 2023-03-02 ENCOUNTER — Encounter: Payer: No Typology Code available for payment source | Admitting: Physical Medicine & Rehabilitation

## 2023-03-09 ENCOUNTER — Encounter
Payer: No Typology Code available for payment source | Attending: Physical Medicine & Rehabilitation | Admitting: Physical Medicine & Rehabilitation

## 2023-03-09 ENCOUNTER — Encounter: Payer: Self-pay | Admitting: Physical Medicine & Rehabilitation

## 2023-03-09 VITALS — BP 130/85 | HR 83 | Ht 69.0 in | Wt 223.0 lb

## 2023-03-09 DIAGNOSIS — F39 Unspecified mood [affective] disorder: Secondary | ICD-10-CM

## 2023-03-09 DIAGNOSIS — M5441 Lumbago with sciatica, right side: Secondary | ICD-10-CM | POA: Diagnosis present

## 2023-03-09 DIAGNOSIS — M542 Cervicalgia: Secondary | ICD-10-CM | POA: Diagnosis not present

## 2023-03-09 DIAGNOSIS — G8929 Other chronic pain: Secondary | ICD-10-CM | POA: Diagnosis present

## 2023-03-09 DIAGNOSIS — M069 Rheumatoid arthritis, unspecified: Secondary | ICD-10-CM | POA: Diagnosis present

## 2023-03-09 MED ORDER — DICLOFENAC SODIUM 1 % EX GEL: 4.0000 g | Freq: Four times a day (QID) | CUTANEOUS | 4 refills | Status: AC

## 2023-03-09 NOTE — Progress Notes (Signed)
Subjective:    Patient ID: Darrell Patrick, male    DOB: [birth year], 52 y.o.   MRN: [id number]  HPI HPI 8/3//23 Darrell Patrick is a past medical history of hypertension, hypothyroidism status post radioactive iodine, rheumatoid arthritis who is here for his chronic lower back pain.  Patient reports he has had chronic low back pain for many years.  He suspects the pain is related to his prior work at [employer] where he had to do a lot of heavy lifting.  He reports he stopped working at this job May 2022.  He developed sciatica around 2008 with shooting pain down his right lower extremity down to his lateral foot.  He reports he felt a popping sensation when he was walking before this pain began. He is not able to recall what work-up was done for his sciatica or any treatments that were attempted for this issue.  He reports the sciatica has improved but he continues to have numbness in the distribution of this pain.  He continues to have sharp burning and aching pain in his lower back.  He also has less severe pain in his neck and thoracic spine.  Additionally, he has chronic pain in most of the joints throughout his body suspected to be related to his rheumatoid arthritis.  He is followed by rheumatology and is on Humira.  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   He reports medications for HA have not significantly reduced his overall pain.  Ibuprofen helps his pain slightly.  He has not tried any other pain medications previously.  He denies any history of kidney or liver disease.   Interval history 01/07/22 Darrell Patrick is here for follow-up regarding his chronic pain he continues to have sharp and aching chronic lower back pain with occasional shooting pain down his right lower leg.  He also has pain in his shoulders and knees.  He has not noted a significant change in his pain with duloxetine at this point.  He does not have any side effects with this medication.  He reports his mood is  frequently decreased due to stressors related to not being able to work.  Denies SI or HI. He continues to take meloxicam that helps reduce his pain.  He would like to avoid taking multiple frequent oral medications.  He has not completed his x-rays of his lower back and neck.   Interval History 03/04/22 Darrell Patrick is here for follow-up regarding his chronic lower back and neck pain.  He also has pain in many other joints such as his shoulders and knees.  He is followed with rheumatology for rheumatoid arthritis.  He has chronic numbness and pain along the lateral right leg going down to his lateral right foot.  He reports his weight is down now that he is off prednisone for his rheumatoid arthritis.  Pain is doing better with duloxetine.  He also recently has noticed having more energy.  He is not using meloxicam but has been using ibuprofen with benefit to his pain.  He recently had x-rays of his lumbar and cervical spine completed.     Interval History 09/08/22 Darrell Patrick is here for follow-up regarding his chronic pain.  He he continues to have severe pain in his lower back and neck.  Lower back pain continues to shoot down his right lower extremity and he has numbness on his right lateral foot.  This been going on for several years but  has worsened a little bit recently.  He continues to follow-up with rheumatology for his rheumatoid arthritis.  He also has been treated for uveitis and reports eyedrops are making his vision more blurry.  He plans to call the eye doctor to follow-up about this.  He also has pain in his shoulders, knees, hands and feet.  Currently his shoulders are the most sore.  He is taking duloxetine and gabapentin with some benefit.  His activities continue to be limited by the pain.  Duloxetine is also helping his mood.    Interval History 01/17/2023 Darrell Patrick is here for follow-up regarding his chronic pain primarily in his lower back and neck.  He continues to have shooting pain  and numbness in his right lateral foot.  He continues to follow with rheumatology and is treated for rheumatoid arthritis.  He has continued pain in his hands, shoulders, elbows.  Knee pain is doing better.  Pain is constant and every day, limiting his activities.  He has found TENS unit to be beneficial and this is helping.  Interval History 03/09/23 Darrell Patrick is here for follow-up regarding his chronic back, neck pain and polyarthralgia.  Patient reports he is doing well regarding his pain overall.  He continues to have pain throughout his arms, legs and joints of his arms and legs however it is currently at a controlled level.  He is happy that he recently was approved for disability.  He continues to use his TENS unit when the pain is severe and finds this to be very helpful.  He has not scheduled his physical therapy but plans to do this sometime soon.  Pain Inventory Average Pain 6 Pain Right Now 6 My pain is sharp and aching  In the last 24 hours, has pain interfered with the following? General activity 2 Relation with others 3 Enjoyment of life 3 What TIME of day is your pain at its worst? morning  and night Sleep (in general) Poor  Pain is worse with: walking, bending, and standing Pain improves with: rest and medication Relief from Meds: 5  No family history on file. Social History   Socioeconomic History   Marital status: Married    Spouse name: Not on file   Number of children: Not on file   Years of education: Not on file   Highest education level: Not on file  Occupational History   Not on file  Tobacco Use   Smoking status: Former   Smokeless tobacco: Never  Vaping Use   Vaping status: Never Used  Substance and Sexual Activity   Alcohol use: Yes    Alcohol/week: 0.0 standard drinks of alcohol    Comment: Social   Drug use: No   Sexual activity: Not on file  Other Topics Concern   Not on file  Social History Narrative   Not on file   Social Determinants  of Health   Financial Resource Strain: Not on file  Food Insecurity: Not on file  Transportation Needs: Not on file  Physical Activity: Not on file  Stress: Not on file  Social Connections: Not on file   No past surgical history on file. No past surgical history on file. Past Medical History:  Diagnosis Date   Hypertension    Thyroid disease    BP 130/85   Pulse 83   Ht 5\' 9"  (1.753 m)   Wt 223 lb (101.2 kg)   SpO2 96%   BMI 32.93 kg/m   Opioid Risk  Score:   Fall Risk Score:  `1  Depression screen [facility] 2/9     01/17/2023   11:50 AM 09/08/2022   10:44 AM 03/04/2022   10:16 AM 01/07/2022   10:55 AM 11/18/2021   10:51 AM 05/05/2017   10:23 AM 08/28/2015   11:07 AM  Depression screen PHQ 2/9  Decreased Interest 0 3 0 2 2 0 0  Down, Depressed, Hopeless 0 3 0 2 2 0 0  PHQ - 2 Score 0 6 0 4 4 0 0  Altered sleeping  1   3    Tired, decreased energy  3   3    Change in appetite  1   2    Feeling bad or failure about yourself   3   2    Trouble concentrating  3   1    Moving slowly or fidgety/restless  1   1    Suicidal thoughts  2   0    PHQ-9 Score  20   16    Difficult doing work/chores  Extremely dIfficult   Somewhat difficult       Review of Systems  Constitutional: Negative.   HENT: Negative.    Eyes: Negative.   Respiratory: Negative.    Cardiovascular: Negative.   Gastrointestinal: Negative.   Endocrine: Negative.   Genitourinary: Negative.   Musculoskeletal:  Positive for arthralgias and gait problem.  Skin: Negative.   Allergic/Immunologic: Negative.   Hematological: Negative.   Psychiatric/Behavioral: Negative.    All other systems reviewed and are negative.      Objective:   Physical Exam  Gen: no distress, normal appearing HEENT: oral mucosa pink and moist, NCAT Chest: normal effort, normal rate of breathing Abd: soft, non-distended Ext: no edema Psych: pleasant, affect slightly flat Skin: intact Nodule  dorsal right wrist Neuro: Awake and  alert, follows commands, cranial nerves II through XII grossly intact,  Decreased sensation over his Right posterior lateral leg and lateral foot-unchanged Strength least 4 out of 5 in all 4 extremities, MMT limited by pain Musculoskeletal:  Lower spine paraspinal tenderness and cervical spine tenderness-unchanged  Bilateral shoulders and trapezius tenderness present.  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 -Lumbar MRI with multilevel mild facet joint disease, degenerative disc disease L4-5, L5-S1 with suspected impingement right S1 -Continue duloxetine 60 mg daily -Order PT consult, will do this in his local area per patient request-patient says this was delayed but he plans to do this early next year -Continue gabapentin to 300 mg 3 times daily  -Order zynex nexwave -reports this is helping his pain continue -Muscle cramps- recommend magnesium supplementation -Discussed foods that are good for pain -Tramadol was previously considered-hold off for now   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  Discussed not using this together at the same time as oral NSAIDs.   Mood disorder -Continue duloxetine 60 mg daily -Denies SI or HI -He reports his mood is doing better   Prior Marijuana use -Recommend continued cessation

## 2023-07-03 ENCOUNTER — Other Ambulatory Visit: Payer: Self-pay | Admitting: *Deleted

## 2023-07-03 ENCOUNTER — Telehealth: Payer: Self-pay | Admitting: Nurse Practitioner

## 2023-07-03 DIAGNOSIS — E559 Vitamin D deficiency, unspecified: Secondary | ICD-10-CM

## 2023-07-03 DIAGNOSIS — E890 Postprocedural hypothyroidism: Secondary | ICD-10-CM

## 2023-07-03 NOTE — Telephone Encounter (Signed)
 Please update his labs

## 2023-07-03 NOTE — Telephone Encounter (Signed)
 Pt needs labs updated for appt next week

## 2023-07-03 NOTE — Telephone Encounter (Signed)
Labs have been updated . 

## 2023-07-11 ENCOUNTER — Ambulatory Visit: Payer: Medicare Other | Admitting: Nurse Practitioner

## 2023-07-11 DIAGNOSIS — E890 Postprocedural hypothyroidism: Secondary | ICD-10-CM

## 2023-07-11 DIAGNOSIS — E559 Vitamin D deficiency, unspecified: Secondary | ICD-10-CM

## 2023-07-13 LAB — T4, FREE: Free T4: 1.08 ng/dL (ref 0.82–1.77)

## 2023-07-13 LAB — TSH: TSH: 15.7 u[IU]/mL — ABNORMAL HIGH (ref 0.450–4.500)

## 2023-07-13 LAB — VITAMIN D 25 HYDROXY (VIT D DEFICIENCY, FRACTURES): Vit D, 25-Hydroxy: 28 ng/mL — ABNORMAL LOW (ref 30.0–100.0)

## 2023-07-14 ENCOUNTER — Ambulatory Visit (INDEPENDENT_AMBULATORY_CARE_PROVIDER_SITE_OTHER): Admitting: Nurse Practitioner

## 2023-07-14 ENCOUNTER — Encounter: Payer: Self-pay | Admitting: Nurse Practitioner

## 2023-07-14 VITALS — BP 120/80 | HR 64 | Ht 68.0 in | Wt 228.6 lb

## 2023-07-14 DIAGNOSIS — E890 Postprocedural hypothyroidism: Secondary | ICD-10-CM

## 2023-07-14 DIAGNOSIS — E559 Vitamin D deficiency, unspecified: Secondary | ICD-10-CM

## 2023-07-14 MED ORDER — LEVOTHYROXINE SODIUM 175 MCG PO TABS: 175.0000 ug | ORAL_TABLET | Freq: Every day | ORAL | 3 refills | Status: AC

## 2023-07-14 MED ORDER — VITAMIN D (ERGOCALCIFEROL) 1.25 MG (50000 UNIT) PO CAPS: 50000.0000 [IU] | ORAL_CAPSULE | ORAL | 3 refills | Status: AC

## 2023-07-14 NOTE — Patient Instructions (Signed)

## 2023-07-14 NOTE — Progress Notes (Signed)
 07/14/2023  Endocrinology follow-up note    Subjective:    Patient ID: Darrell Patrick, male    DOB: June 07, 1970,    Past Medical History:  Diagnosis Date   Hypertension    Thyroid disease    History reviewed. No pertinent surgical history. Social History   Socioeconomic History   Marital status: Married    Spouse name: Not on file   Number of children: Not on file   Years of education: Not on file   Highest education level: Not on file  Occupational History   Not on file  Tobacco Use   Smoking status: Former   Smokeless tobacco: Never  Vaping Use   Vaping status: Never Used  Substance and Sexual Activity   Alcohol use: Yes    Alcohol/week: 0.0 standard drinks of alcohol    Comment: Social   Drug use: No   Sexual activity: Not on file  Other Topics Concern   Not on file  Social History Narrative   Not on file   Social Drivers of Health   Financial Resource Strain: Not on file  Food Insecurity: Not on file  Transportation Needs: Not on file  Physical Activity: Not on file  Stress: Not on file  Social Connections: Not on file   Outpatient Encounter Medications as of 07/14/2023  Medication Sig   Adalimumab-atto 40 MG/0.4ML SOAJ INJECT 40 MG Subcutaneous EVERY OTHER WEEK for 30 days   ALPRAZolam (XANAX) 1 MG tablet Take 1 mg by mouth once.   amLODipine (NORVASC) 10 MG tablet Take 10 mg by mouth daily.   atropine 1 % ophthalmic solution    cetirizine (ZYRTEC) 10 MG tablet Take 10 mg by mouth daily.   cyclobenzaprine (FLEXERIL) 5 MG tablet Take by mouth.   cyclopentolate (CYCLODRYL,CYCLOGYL) 1 % ophthalmic solution INSTILL 1 DROP INTO RIGHT EYE THREE TIMES DAILY   diclofenac Sodium (VOLTAREN) 1 % GEL Apply 4 g topically 4 (four) times daily.   dorzolamide-timolol (COSOPT) 2-0.5 % ophthalmic solution Apply to eye.   dorzolamide-timolol (COSOPT) 22.3-6.8 MG/ML ophthalmic solution 1 drop 2 (two) times daily.   DULoxetine (CYMBALTA) 60 MG  capsule Take 1 capsule (60 mg total) by mouth daily.   DULOXETINE HCL PO Take 1 capsule by mouth daily.   ERGOCALCIFEROL PO Take 1 capsule by mouth once a week.   esomeprazole (NEXIUM) 20 MG capsule Take 20 mg by mouth daily.   folic acid (FOLVITE) 1 MG tablet Take 1 tablet by mouth daily.   gabapentin (NEURONTIN) 100 MG capsule Take 1 capsule by mouth 3 (three) times daily.   gabapentin (NEURONTIN) 300 MG capsule Take 1 capsule (300 mg total) by mouth 3 (three) times daily.   HUMIRA PEN 40 MG/0.4ML PNKT SMARTSIG:40 Milligram(s) SUB-Q Every 2 Weeks   hydrochlorothiazide (HYDRODIURIL) 50 MG tablet Take 50 mg by mouth daily.   LORazepam (ATIVAN) 1 MG tablet Take 1 mg by mouth every 6 (six) hours as needed for anxiety.   methotrexate (RHEUMATREX) 2.5 MG tablet Take 15 mg by mouth once a week.   prednisoLONE acetate (PRED FORTE) 1 % ophthalmic suspension    sildenafil (VIAGRA) 100 MG tablet Take 1 tablet by mouth daily.   [DISCONTINUED] levothyroxine (SYNTHROID) 175 MCG tablet Take 1 tablet by mouth daily.   [DISCONTINUED] Vitamin D, Ergocalciferol, (DRISDOL) 1.25 MG (50000 UNIT) CAPS capsule Take  1 capsule (50,000 Units total) by mouth every 7 (seven) days.   levothyroxine (SYNTHROID) 175 MCG tablet Take 1 tablet (175 mcg total) by mouth daily.   Vitamin D, Ergocalciferol, (DRISDOL) 1.25 MG (50000 UNIT) CAPS capsule Take 1 capsule (50,000 Units total) by mouth every 7 (seven) days.   No facility-administered encounter medications on file as of 07/14/2023.   ALLERGIES: No Known Allergies VACCINATION STATUS:  There is no immunization history on file for this patient.  HPI Thyroid Problem Presents for follow-up visit. Symptoms include fatigue. Patient reports no anxiety, cold intolerance, constipation, depressed mood, diarrhea, heat intolerance, leg swelling, palpitations, tremors, weight gain or weight loss. The symptoms have been stable.    The patient is a engaged in follow-up for RAI  induced hypothyroidism. He is status post RAI for treatment of Graves' disease on 03/09/2015.  He is on Levothyroxine 175 g by mouth every morning. He reports compliance.    The patient denies family history of thyroid dysfunction.  Patient denies personal history of goiter.  He also has hypertension.  Review of systems  Constitutional: + Minimally fluctuating body weight,  current Body mass index is 34.76 kg/m. , + fatigue, no subjective hyperthermia, no subjective hypothermia Eyes: + blurry vision- sees [facility] for Uveitis, + xerophthalmia ENT: no sore throat, no nodules palpated in throat, no dysphagia/odynophagia, no hoarseness Cardiovascular: no chest pain, no shortness of breath, no palpitations, no leg swelling Respiratory: no cough, no shortness of breath Gastrointestinal: no nausea/vomiting/diarrhea Musculoskeletal:  diffuse muscle/joint aches- hx RA Skin: no rashes, no hyperemia Neurological: no tremors, no numbness, no tingling, no dizziness Psychiatric: no depression, no anxiety   Objective:    BP 120/80 (BP Location: Right Arm, Patient Position: Sitting, Cuff Size: Large)   Pulse 64   Ht 5\' 8"  (1.727 m)   Wt 228 lb 9.6 oz (103.7 kg)   BMI 34.76 kg/m   Wt Readings from Last 3 Encounters:  07/14/23 228 lb 9.6 oz (103.7 kg)  03/09/23 223 lb (101.2 kg)  01/17/23 219 lb 12.8 oz (99.7 kg)    BP Readings from Last 3 Encounters:  07/14/23 120/80  03/09/23 130/85  01/17/23 123/76     Physical Exam- Limited  Constitutional:  Body mass index is 34.76 kg/m. , not in acute distress, normal state of mind Eyes:  EOMI, no exophthalmos Musculoskeletal: no gross deformities, strength intact in all four extremities, no gross restriction of joint movements Skin:  no rashes, no hyperemia Neurological: no tremor with outstretched hands    Recent Results (from the past 2160 hours)  VITAMIN D 25 Hydroxy (Vit-D Deficiency, Fractures)     Status: Abnormal    Collection Time: 07/12/23  2:03 PM  Result Value Ref Range   Vit D, 25-Hydroxy 28.0 (L) 30.0 - 100.0 ng/mL    Comment: Vitamin D deficiency has been defined by the Institute of Medicine and an Endocrine Society practice guideline as a level of serum 25-OH vitamin D less than 20 ng/mL (1,2). The Endocrine Society went on to further define vitamin D insufficiency as a level between 21 and 29 ng/mL (2). 1. IOM (Institute of Medicine). 2010. Dietary reference    intakes for calcium and D. [place]: The    [employer]. 2. Holick MF, Binkley Fuqu[place]-Varina, Bischoff-Ferrari HA, et al.    Evaluation, treatment, and prevention of vitamin D    deficiency: an Endocrine Society clinical practice    guideline. JCEM. 2011 Jul; 96(7):1911-30.   T4, Free  Status: None   Collection Time: 07/12/23  2:03 PM  Result Value Ref Range   Free T4 1.08 0.82 - 1.77 ng/dL  TSH     Status: Abnormal   Collection Time: 07/12/23  2:03 PM  Result Value Ref Range   TSH 15.700 (H) 0.450 - 4.500 uIU/mL     Latest Reference Range & Units 05/11/20 15:13 11/05/20 14:37 01/29/21 11:13 05/31/21 16:42 07/05/22 12:36 07/12/23 14:03  Vitamin D, 25-Hydroxy 30.0 - 100.0 ng/mL     7.9 (L) 28.0 (L)  TSH 0.450 - 4.500 uIU/mL 3.310 1.340 0.628 0.437 (L) 3.410 15.700 (H)  T4,Free(Direct) 0.82 - 1.77 ng/dL 2.95 6.21 3.08 6.57 8.46 1.08  (L): Data is abnormally low (H): Data is abnormally high  Assessment & Plan:   1.  RAI hypothyroidism  - He received radioactive iodine thyroid ablation for Graves' disease in November 2017.     -His previsit thyroid function tests are consistent with appropriate hormone replacement (TSH elevated but Free T4 normal- and he notes he has been taking his Nexium too close to his thyroid pill in the morning).  He is advised to continue Levothyroxine 175 mcg po daily before breakfast but to make sure to take it properly.    - We discussed about the correct intake of his thyroid hormone, on  empty stomach at fasting, with water, separated by at least 30 minutes from breakfast and other medications,  and separated by more than 4 hours from calcium, iron, multivitamins, acid reflux medications (PPIs). -Patient is made aware of the fact that thyroid hormone replacement is needed for life, dose to be adjusted by periodic monitoring of thyroid function tests.  2. Vitamin D deficiency His most recent Vitamin D level from 07/12/23 was 28, improved from last year but still not quite to goal.  He did go without his Ergocalciferol for a while, just recently restarted it.  He is advised to continue Ergocalciferol 50000 units PO weekly.    I spent  14  minutes in the care of the patient today including review of labs from Thyroid Function, CMP, and other relevant labs ; imaging/biopsy records (current and previous including abstractions from other facilities); face-to-face time discussing  his lab results and symptoms, medications doses, his options of short and long term treatment based on the latest standards of care / guidelines;   and documenting the encounter.  Darrell Patrick  participated in the discussions, expressed understanding, and voiced agreement with the above plans.  All questions were answered to his satisfaction. he is encouraged to contact clinic should he have any questions or concerns prior to his return visit.   Follow up plan: Return in about 1 year (around 07/13/2024) for Thyroid follow up, Previsit labs.    Ronny Bacon, [facility] [address] [place], [place] [address] Phone: [phone] Fax: [phone]  07/14/2023, 10:39 AM

## 2023-09-05 ENCOUNTER — Encounter: Payer: No Typology Code available for payment source | Admitting: Physical Medicine & Rehabilitation

## 2023-10-30 ENCOUNTER — Encounter: Admitting: Physical Medicine & Rehabilitation

## 2023-12-25 ENCOUNTER — Encounter: Admitting: Physical Medicine & Rehabilitation

## 2024-01-25 ENCOUNTER — Encounter: Payer: Self-pay | Admitting: Physical Medicine & Rehabilitation

## 2024-01-25 ENCOUNTER — Encounter: Attending: Physical Medicine & Rehabilitation | Admitting: Physical Medicine & Rehabilitation

## 2024-01-25 VITALS — BP 128/80 | HR 61 | Ht 68.0 in | Wt 225.0 lb

## 2024-01-25 DIAGNOSIS — M5441 Lumbago with sciatica, right side: Secondary | ICD-10-CM | POA: Diagnosis present

## 2024-01-25 DIAGNOSIS — M25561 Pain in right knee: Secondary | ICD-10-CM | POA: Diagnosis present

## 2024-01-25 DIAGNOSIS — G8929 Other chronic pain: Secondary | ICD-10-CM

## 2024-01-25 DIAGNOSIS — M542 Cervicalgia: Secondary | ICD-10-CM

## 2024-01-25 DIAGNOSIS — M069 Rheumatoid arthritis, unspecified: Secondary | ICD-10-CM | POA: Diagnosis present

## 2024-01-25 NOTE — Progress Notes (Signed)
 Subjective:    Patient ID: Darrell Patrick, male    DOB: 05-21-1970, 53 y.o.   MRN: [id number]  HPI HPI 8/3//23 Darrell Patrick is a past medical history of hypertension, hypothyroidism status post radioactive iodine, rheumatoid arthritis who is here for his chronic lower back pain.  Patient reports he has had chronic low back pain for many years.  He suspects the pain is related to his prior work at [employer] where he had to do a lot of heavy lifting.  He reports he stopped working at this job May 2022.  He developed sciatica around 2008 with shooting pain down his right lower extremity down to his lateral foot.  He reports he felt a popping sensation when he was walking before this pain began. He is not able to recall what work-up was done for his sciatica or any treatments that were attempted for this issue.  He reports the sciatica has improved but he continues to have numbness in the distribution of this pain.  He continues to have sharp burning and aching pain in his lower back.  He also has less severe pain in his neck and thoracic spine.  Additionally, he has chronic pain in most of the joints throughout his body suspected to be related to his rheumatoid arthritis.  He is followed by rheumatology and is on Humira.  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   He reports medications for HA have not significantly reduced his overall pain.  Ibuprofen helps his pain slightly.  He has not tried any other pain medications previously.  He denies any history of kidney or liver disease.   Interval history 01/07/22 Darrell. Patrick is here for follow-up regarding his chronic pain he continues to have sharp and aching chronic lower back pain with occasional shooting pain down his right lower leg.  He also has pain in his shoulders and knees.  He has not noted a significant change in his pain with duloxetine  at this point.  He does not have any side effects with this medication.  He reports his mood is  frequently decreased due to stressors related to not being able to work.  Denies SI or HI. He continues to take meloxicam that helps reduce his pain.  He would like to avoid taking multiple frequent oral medications.  He has not completed his x-rays of his lower back and neck.   Interval History 03/04/22 Darrell. Patrick is here for follow-up regarding his chronic lower back and neck pain.  He also has pain in many other joints such as his shoulders and knees.  He is followed with rheumatology for rheumatoid arthritis.  He has chronic numbness and pain along the lateral right leg going down to his lateral right foot.  He reports his weight is down now that he is off prednisone for his rheumatoid arthritis.  Pain is doing better with duloxetine .  He also recently has noticed having more energy.  He is not using meloxicam but has been using ibuprofen with benefit to his pain.  He recently had x-rays of his lumbar and cervical spine completed.     Interval History 09/08/22 Darrell. Patrick is here for follow-up regarding his chronic pain.  He he continues to have severe pain in his lower back and neck.  Lower back pain continues to shoot down his right lower extremity and he has numbness on his right lateral foot.  This been going on for several years but  has worsened a little bit recently.  He continues to follow-up with rheumatology for his rheumatoid arthritis.  He also has been treated for uveitis and reports eyedrops are making his vision more blurry.  He plans to call the eye doctor to follow-up about this.  He also has pain in his shoulders, knees, hands and feet.  Currently his shoulders are the most sore.  He is taking duloxetine  and gabapentin  with some benefit.  His activities continue to be limited by the pain.  Duloxetine  is also helping his mood.    Interval History 01/17/2023 Darrell. Patrick is here for follow-up regarding his chronic pain primarily in his lower back and neck.  He continues to have shooting pain  and numbness in his right lateral foot.  He continues to follow with rheumatology and is treated for rheumatoid arthritis.  He has continued pain in his hands, shoulders, elbows.  Knee pain is doing better.  Pain is constant and every day, limiting his activities.  He has found TENS unit to be beneficial and this is helping.  Interval History 03/09/23 Darrell. Patrick is here for follow-up regarding his chronic back, neck pain and polyarthralgia.  Patient reports he is doing well regarding his pain overall.  He continues to have pain throughout his arms, legs and joints of his arms and legs however it is currently at a controlled level.  He is happy that he recently was approved for disability.  He continues to use his TENS unit when the pain is severe and finds this to be very helpful.  He has not scheduled his physical therapy but plans to do this sometime soon.  Interval History 01/25/24 Reports ongoing issues with the right knee, which is the most problematic area. Experiences intermittent swelling and pain. The pain sometimes radiates down the leg. The left knee becomes sore but does not swell. Also uses Voltaren  gel and Biofreeze for symptomatic relief. A knee brace is used intermittently.   Review of Systems: - Back: Reports doing well, with some stiffness but no new sharp pains or radicular symptoms. - Neck: Reports doing well. - Mood: Reports feeling down at times due to financial stressors related to being on disability. Positive life events include daughter's graduation and new job.  Medications: - Continues duloxetine  and gabapentin . Reports these are still helping. - Has taken injections for rheumatoid arthritis in the past, but is not current. Plans to pay a balance to the rheumatologist to resume injections.  Pain Inventory Average Pain 6 Pain Right Now 6 My pain is constant, sharp, and aching  In the last 24 hours, has pain interfered with the following? General activity 3 Relation  with others 3 Enjoyment of life 4 What TIME of day is your pain at its worst? morning  Sleep (in general) Poor  Pain is worse with: walking, bending, and standing Pain improves with: rest, heat/ice, and medication Relief from Meds: 5  No family history on file. Social History   Socioeconomic History   Marital status: Married    Spouse name: Not on file   Number of children: Not on file   Years of education: Not on file   Highest education level: Not on file  Occupational History   Not on file  Tobacco Use   Smoking status: Former   Smokeless tobacco: Never  Vaping Use   Vaping status: Never Used  Substance and Sexual Activity   Alcohol use: Yes    Alcohol/week: 0.0 standard drinks of alcohol  Comment: Social   Drug use: No   Sexual activity: Not on file  Other Topics Concern   Not on file  Social History Narrative   Not on file   Social Drivers of Health   Financial Resource Strain: Not on file  Food Insecurity: Not on file  Transportation Needs: Not on file  Physical Activity: Not on file  Stress: Not on file  Social Connections: Not on file   No past surgical history on file. No past surgical history on file. Past Medical History:  Diagnosis Date   Hypertension    Thyroid  disease    There were no vitals taken for this visit.  Opioid Risk Score:   Fall Risk Score:  `1  Depression screen PHQ 2/9     03/09/2023    1:11 PM 01/17/2023   11:50 AM 09/08/2022   10:44 AM 03/04/2022   10:16 AM 01/07/2022   10:55 AM 11/18/2021   10:51 AM 05/05/2017   10:23 AM  Depression screen PHQ 2/9  Decreased Interest 1 0 3 0 2 2 0  Down, Depressed, Hopeless 1 0 3 0 2 2 0  PHQ - 2 Score 2 0 6 0 4 4 0  Altered sleeping   1   3   Tired, decreased energy   3   3   Change in appetite   1   2   Feeling bad or failure about yourself    3   2   Trouble concentrating   3   1   Moving slowly or fidgety/restless   1   1   Suicidal thoughts   2   0   PHQ-9 Score   20   16    Difficult doing work/chores   Extremely dIfficult   Somewhat difficult      Review of Systems  Constitutional: Negative.   HENT: Negative.    Eyes: Negative.   Respiratory: Negative.    Cardiovascular: Negative.   Gastrointestinal: Negative.   Endocrine: Negative.   Genitourinary: Negative.   Musculoskeletal:  Positive for arthralgias and gait problem.       B/L shoulder pain B/L knee pain B/L foot pain B/L hand pain  Skin: Negative.   Allergic/Immunologic: Negative.   Hematological: Negative.   Psychiatric/Behavioral: Negative.    All other systems reviewed and are negative.      Objective:   Physical Exam  Gen: no distress, normal appearing HEENT: oral mucosa pink and moist, NCAT Chest: normal effort, normal rate of breathing Abd: soft, non-distended Ext: no edema Psych: pleasant, affect slightly flat Skin: intact Nodule  dorsal right wrist Neuro: Awake and alert, follows commands, cranial nerves II through XII grossly intact,  Decreased sensation over his Right posterior lateral leg and lateral foot-unchanged Strength least 4 out of 5 in all 4 extremities, MMT limited by pain Musculoskeletal:  Right Knee: Tender more superiorly. Pt reports this area will typically swells. No significant crepitus. No pain varus or valgus stress Minimal L spine and C spine TTP   Prior exam Lower spine paraspinal tenderness and cervical spine tenderness-unchanged  Bilateral shoulders and trapezius tenderness present.  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 -Lumbar MRI with multilevel mild facet joint disease, degenerative disc disease L4-5, L5-S1 with suspected impingement right S1 -Continue duloxetine  60 mg daily -  Order PT consult, will do this in his local area per patient request-patient says this was delayed but he  plans to do this early next year -Continue gabapentin  to 300 mg 3 times daily  -Order zynex nexwave -reports this is helping his pain continue -Muscle cramps- recommend magnesium supplementation -Discussed foods that are good for pain -Tramadol was previously considered-hold off for now -Controlled overall, continue current regimen   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 -Voltaren  gel ordered.he tries to avoid using oral NSAIDs when possible.  Discussed not using this together at the same time as oral NSAIDs.   Mood disorder -Continue duloxetine  60 mg daily -Denies SI or HI -He reports his mood is doing better   Prior Marijuana use -Recommend continued cessation  Right knee pain-chronic, likely multifactorial including osteoarthritis and inflammatory arthritis (rheumatoid arthritis).  Obtain an X-ray of the right knee to assess for arthritis. Referral to be sent to [facility].     - Discussed corticosteroid injection for symptomatic relief. Reports understanding and is agreeable to proceeding after X-ray review. The procedure, risks, benefits, and alternatives were discussed, including that it is for symptomatic relief and not a cure. Also discussed gel injections as an alternative.     - Continue use of topical agents (Voltaren , Biofreeze) and knee brace as needed.

## 2024-03-01 ENCOUNTER — Encounter: Attending: Physical Medicine & Rehabilitation | Admitting: Physical Medicine & Rehabilitation

## 2024-03-01 DIAGNOSIS — M069 Rheumatoid arthritis, unspecified: Secondary | ICD-10-CM | POA: Insufficient documentation

## 2024-03-01 DIAGNOSIS — G8929 Other chronic pain: Secondary | ICD-10-CM | POA: Insufficient documentation

## 2024-03-01 DIAGNOSIS — M25561 Pain in right knee: Secondary | ICD-10-CM | POA: Insufficient documentation

## 2024-03-01 DIAGNOSIS — M542 Cervicalgia: Secondary | ICD-10-CM | POA: Insufficient documentation

## 2024-03-01 DIAGNOSIS — M5441 Lumbago with sciatica, right side: Secondary | ICD-10-CM | POA: Insufficient documentation

## 2024-07-15 ENCOUNTER — Ambulatory Visit: Admitting: Nurse Practitioner
